# Patient Record
Sex: Female | Born: 1950
Health system: Southern US, Community
[De-identification: ages and names within clinical notes are randomized; demographics above are authoritative.]

## PROBLEM LIST (undated history)

## (undated) DIAGNOSIS — U071 COVID-19: Secondary | ICD-10-CM

## (undated) DIAGNOSIS — E785 Hyperlipidemia, unspecified: Secondary | ICD-10-CM

## (undated) HISTORY — DX: Hyperlipidemia, unspecified: E78.5

## (undated) HISTORY — PX: TUBAL LIGATION: SHX77

## (undated) HISTORY — PX: CHOLECYSTECTOMY: SHX55

---

## 2005-08-11 ENCOUNTER — Other Ambulatory Visit: Payer: Self-pay

## 2005-08-11 ENCOUNTER — Emergency Department: Payer: Self-pay | Admitting: Emergency Medicine

## 2005-12-25 ENCOUNTER — Emergency Department: Payer: Self-pay | Admitting: Emergency Medicine

## 2010-05-14 ENCOUNTER — Emergency Department: Payer: Self-pay | Admitting: Emergency Medicine

## 2010-10-16 ENCOUNTER — Emergency Department: Payer: Self-pay | Admitting: Emergency Medicine

## 2013-12-14 ENCOUNTER — Emergency Department: Payer: Self-pay | Admitting: Emergency Medicine

## 2014-02-08 ENCOUNTER — Emergency Department: Payer: Self-pay | Admitting: Internal Medicine

## 2014-02-14 ENCOUNTER — Encounter (HOSPITAL_COMMUNITY): Payer: Self-pay | Admitting: Emergency Medicine

## 2014-02-14 ENCOUNTER — Emergency Department (HOSPITAL_COMMUNITY): Payer: Self-pay

## 2014-02-14 DIAGNOSIS — J40 Bronchitis, not specified as acute or chronic: Secondary | ICD-10-CM | POA: Insufficient documentation

## 2014-02-14 DIAGNOSIS — J111 Influenza due to unidentified influenza virus with other respiratory manifestations: Secondary | ICD-10-CM | POA: Insufficient documentation

## 2014-02-14 LAB — I-STAT TROPONIN, ED: Troponin i, poc: 0.01 ng/mL (ref 0.00–0.08)

## 2014-02-14 LAB — BASIC METABOLIC PANEL
BUN: 10 mg/dL (ref 6–23)
CHLORIDE: 100 meq/L (ref 96–112)
CO2: 23 meq/L (ref 19–32)
Calcium: 9.4 mg/dL (ref 8.4–10.5)
Creatinine, Ser: 0.74 mg/dL (ref 0.50–1.10)
GFR calc Af Amer: >90 mL/min (ref >90)
GFR calc non Af Amer: 90 mL/min — ABNORMAL LOW (ref >90)
GLUCOSE: 113 mg/dL — AB (ref 70–99)
Potassium: 3.7 mEq/L (ref 3.7–5.3)
SODIUM: 140 meq/L (ref 137–147)

## 2014-02-14 LAB — CBC
HCT: 38.6 % (ref 36.0–46.0)
HEMOGLOBIN: 13.7 g/dL (ref 12.0–15.0)
MCH: 29.7 pg (ref 26.0–34.0)
MCHC: 35.5 g/dL (ref 30.0–36.0)
MCV: 83.5 fL (ref 78.0–100.0)
Platelets: 256 10*3/uL (ref 150–400)
RBC: 4.62 MIL/uL (ref 3.87–5.11)
RDW: 13.2 % (ref 11.5–15.5)
WBC: 6.5 10*3/uL (ref 4.0–10.5)

## 2014-02-14 NOTE — ED Notes (Signed)
PT states diagnosed with the Flu on Friday and now with chest and rib pain, cough.  Pt reports some sob.

## 2014-02-15 ENCOUNTER — Emergency Department (HOSPITAL_COMMUNITY)
Admission: EM | Admit: 2014-02-15 | Discharge: 2014-02-15 | Disposition: A | Discharge status: Home / Self Care | Payer: Self-pay | Attending: Emergency Medicine | Admitting: Emergency Medicine

## 2014-02-15 DIAGNOSIS — J111 Influenza due to unidentified influenza virus with other respiratory manifestations: Secondary | ICD-10-CM

## 2014-02-15 DIAGNOSIS — J40 Bronchitis, not specified as acute or chronic: Secondary | ICD-10-CM

## 2014-02-15 LAB — HEPATIC FUNCTION PANEL
ALBUMIN: 3.5 g/dL (ref 3.5–5.2)
ALT: 11 U/L (ref 0–35)
AST: 15 U/L (ref 0–37)
Alkaline Phosphatase: 57 U/L (ref 39–117)
TOTAL PROTEIN: 7 g/dL (ref 6.0–8.3)
Total Bilirubin: 0.4 mg/dL (ref 0.3–1.2)

## 2014-02-15 MED ORDER — HYDROCODONE-ACETAMINOPHEN 5-325 MG PO TABS: 1.0000 | ORAL_TABLET | ORAL | Status: DC | PRN

## 2014-02-15 MED ORDER — IPRATROPIUM BROMIDE 0.02 % IN SOLN
RESPIRATORY_TRACT | Status: AC
  Administered 2014-02-15: 03:00:00
  Filled 2014-02-15: qty 2.5

## 2014-02-15 MED ORDER — ALBUTEROL SULFATE (2.5 MG/3ML) 0.083% IN NEBU
INHALATION_SOLUTION | RESPIRATORY_TRACT | Status: AC
  Administered 2014-02-15: 03:00:00
  Filled 2014-02-15: qty 3

## 2014-02-15 MED ORDER — METHYLPREDNISOLONE SODIUM SUCC 125 MG IJ SOLR
125.0000 mg | Freq: Once | INTRAMUSCULAR | Status: AC
  Administered 2014-02-15: 125 mg via INTRAVENOUS
  Filled 2014-02-15: qty 2

## 2014-02-15 MED ORDER — ONDANSETRON HCL 4 MG/2ML IJ SOLN
INTRAMUSCULAR | Status: AC
  Filled 2014-02-15: qty 2

## 2014-02-15 MED ORDER — PREDNISONE 20 MG PO TABS: 60.0000 mg | ORAL_TABLET | Freq: Every day | ORAL | Status: DC

## 2014-02-15 NOTE — ED Provider Notes (Signed)
CSN: [id number]     Arrival date & time 02/14/14  2226 History   First MD Initiated Contact with Patient 02/15/14 [phone]     Chief Complaint  Patient presents with  . Chest Pain     (Consider location/radiation/quality/duration/timing/severity/associated sxs/prior Treatment) Patient is a 63 y.o. female presenting with chest pain. The history is provided by the patient.  Chest Pain She has been sick for about one week with a nonproductive cough with associated nausea but no vomiting. Denies fever or sweats. She had some chills last night but does feel at times she had chills. There's been associated anorexia and generalized weakness. There has been some aching in her back and in her legs. She rates that pain at 5/10. She went to another facility 4 days ago and had a positive test for influenza and was given a prescription for an inhaler. She's been using the inhaler with no benefit. She was not started on any antiviral medications. She did have some mild diarrhea today.  History reviewed. No pertinent past medical history. Past Surgical History  Procedure Laterality Date  . Cholecystectomy    . Tubal ligation     No family history on file. History  Substance Use Topics  . Smoking status: Never Smoker   . Smokeless tobacco: Not on file  . Alcohol Use: No   OB History   Grav Para Term Preterm Abortions TAB SAB Ect Mult Living                 Review of Systems  Cardiovascular: Positive for chest pain.  All other systems reviewed and are negative.     Allergies  Review of patient's allergies indicates no known allergies.  Home Medications   Current Outpatient Rx  Name  Route  Sig  Dispense  Refill  . HYDROcodone-acetaminophen (NORCO) 5-325 MG per tablet   Oral   Take 1 tablet by mouth every 4 (four) hours as needed for moderate pain (or coughing).   10 tablet   0   . predniSONE (DELTASONE) 20 MG tablet   Oral   Take 3 tablets (60 mg total) by mouth daily.   15 tablet    0    BP 131/81  Pulse 98  Temp(Src) 99.1 F (37.3 C) (Oral)  Resp 18  SpO2 96% Physical Exam  Nursing note and vitals reviewed.  63 year old female, resting comfortably and in no acute distress. Vital signs are normal. Oxygen saturation is 96%, which is normal. Head is normocephalic and atraumatic. PERRLA, EOMI. Oropharynx is clear. Neck is nontender and supple without adenopathy or JVD. Back is nontender and there is no CVA tenderness. Lungs have slightly prolonged exhalation phase without overt rales, wheezes, or rhonchi with tidal breathing. With forced exhalation, moderate expiratory rhonchi are present. Chest is nontender. Heart has regular rate and rhythm without murmur. Abdomen is soft, flat, nontender without masses or hepatosplenomegaly and peristalsis is normoactive. Extremities have no cyanosis or edema, full range of motion is present. Skin is warm and dry without rash. Neurologic: Mental status is normal, cranial nerves are intact, there are no motor or sensory deficits.  ED Course  Procedures (including critical care time) Labs Review Results for orders placed during the hospital encounter of 02/15/14  CBC      Result Value Ref Range   WBC 6.5  4.0 - 10.5 K/uL   RBC 4.62  3.87 - 5.11 MIL/uL   Hemoglobin 13.7  12.0 - 15.0 g/dL  HCT 38.6  36.0 - 46.0 %   MCV 83.5  78.0 - 100.0 fL   MCH 29.7  26.0 - 34.0 pg   MCHC 35.5  30.0 - 36.0 g/dL   RDW 69.6  29.5 - 28.4 %   Platelets 256  150 - 400 K/uL  BASIC METABOLIC PANEL      Result Value Ref Range   Sodium 140  137 - 147 mEq/L   Potassium 3.7  3.7 - 5.3 mEq/L   Chloride 100  96 - 112 mEq/L   CO2 23  19 - 32 mEq/L   Glucose, Bld 113 (*) 70 - 99 mg/dL   BUN 10  6 - 23 mg/dL   Creatinine, Ser 1.32  0.50 - 1.10 mg/dL   Calcium 9.4  8.4 - 44.0 mg/dL   GFR calc non Af Amer 90 (*) >90 mL/min   GFR calc Af Amer >90  >90 mL/min  HEPATIC FUNCTION PANEL      Result Value Ref Range   Total Protein 7.0  6.0 - 8.3 g/dL    Albumin 3.5  3.5 - 5.2 g/dL   AST 15  0 - 37 U/L   ALT 11  0 - 35 U/L   Alkaline Phosphatase 57  39 - 117 U/L   Total Bilirubin 0.4  0.3 - 1.2 mg/dL   Bilirubin, Direct <1.0  0.0 - 0.3 mg/dL   Indirect Bilirubin NOT CALCULATED  0.3 - 0.9 mg/dL  I-STAT TROPOININ, ED      Result Value Ref Range   Troponin i, poc 0.01  0.00 - 0.08 ng/mL   Comment 3            Imaging Review Dg Chest 2 View  02/14/2014   CLINICAL DATA:  Chest and rib pain.  Cough.  Shortness of breath.  EXAM: CHEST  2 VIEW  COMPARISON:  Chest radiograph performed 12/14/2013  FINDINGS: The lungs are well-aerated. Mild bibasilar opacities may reflect atelectasis or mild pneumonia, not well characterized on the lateral view. There is no evidence of pleural effusion or pneumothorax.  The heart is borderline normal in size; the mediastinal contour is within normal limits. No acute osseous abnormalities are seen. Clips are noted within the right upper quadrant, reflecting prior cholecystectomy.  IMPRESSION: Mild bibasilar airspace opacities may reflect atelectasis or mild pneumonia.   Electronically Signed   By: Roanna Raider M.D.   On: 02/14/2014 23:22   Images viewed by me.  MDM   Final diagnoses:  Bronchitis  Influenza    Ongoing respiratory symptoms and patient recently diagnosed with influenza. Chest x-ray will be obtained to rule out secondary pneumonia. Laboratory work had been ordered at triage and was unremarkable but did not include hepatic functions. These will be checked. She'll be given a therapeutic trial of albuterol with ipratropium.  She noticed significant improvement with albuterol with ipratropium. Chest x-ray is read by radiologist as atelectasis versus mild pneumonia. I have reviewed the images and I feel that they represent pneumonia. Hepatic function studies are normal. Patient is advised of these results. She is advised to continue using her inhaler at home but she will also be started on prednisone.  She's given a dose of methylprednisolone and sent home with prescription for prednisone. She is complaining of inability to sleep because of coughing. She is given a prescription for hydrocodone-acetaminophen to take at bedtime to help suppress the cough so she can sleep.  Dione Booze, MD 02/15/14 [phone]

## 2014-02-15 NOTE — Discharge Instructions (Signed)
Acute Bronchitis Bronchitis is inflammation of the airways that extend from the windpipe into the lungs (bronchi). The inflammation often causes mucus to develop. This leads to a cough, which is the most common symptom of bronchitis.  In acute bronchitis, the condition usually develops suddenly and goes away over time, usually in a couple weeks. Smoking, allergies, and asthma can make bronchitis worse. Repeated episodes of bronchitis may cause further lung problems.  CAUSES Acute bronchitis is most often caused by the same virus that causes a cold. The virus can spread from person to person (contagious).  SIGNS AND SYMPTOMS   Cough.   Fever.   Coughing up mucus.   Body aches.   Chest congestion.   Chills.   Shortness of breath.   Sore throat.  DIAGNOSIS  Acute bronchitis is usually diagnosed through a physical exam. Tests, such as chest X-rays, are sometimes done to rule out other conditions.  TREATMENT  Acute bronchitis usually goes away in a couple weeks. Often times, no medical treatment is necessary. Medicines are sometimes given for relief of fever or cough. Antibiotics are usually not needed but may be prescribed in certain situations. In some cases, an inhaler may be recommended to help reduce shortness of breath and control the cough. A cool mist vaporizer may also be used to help thin bronchial secretions and make it easier to clear the chest.  HOME CARE INSTRUCTIONS  Get plenty of rest.   Drink enough fluids to keep your urine clear or pale yellow (unless you have a medical condition that requires fluid restriction). Increasing fluids may help thin your secretions and will prevent dehydration.   Only take over-the-counter or prescription medicines as directed by your health care provider.   Avoid smoking and secondhand smoke. Exposure to cigarette smoke or irritating chemicals will make bronchitis worse. If you are a smoker, consider using nicotine gum or skin  patches to help control withdrawal symptoms. Quitting smoking will help your lungs heal faster.   Reduce the chances of another bout of acute bronchitis by washing your hands frequently, avoiding people with cold symptoms, and trying not to touch your hands to your mouth, nose, or eyes.   Follow up with your health care provider as directed.  SEEK MEDICAL CARE IF: Your symptoms do not improve after 1 week of treatment.  SEEK IMMEDIATE MEDICAL CARE IF:  You develop an increased fever or chills.   You have chest pain.   You have severe shortness of breath.  You have bloody sputum.   You develop dehydration.  You develop fainting.  You develop repeated vomiting.  You develop a severe headache. MAKE SURE YOU:   Understand these instructions.  Will watch your condition.  Will get help right away if you are not doing well or get worse. Document Released: 12/04/2004 Document Revised: 06/29/2013 Document Reviewed: 04/19/2013 [facility] Patient Information 2014 [place].  Prednisone tablets What is this medicine? PREDNISONE (PRED ni sone) is a corticosteroid. It is commonly used to treat inflammation of the skin, joints, lungs, and other organs. Common conditions treated include asthma, allergies, and arthritis. It is also used for other conditions, such as blood disorders and diseases of the adrenal glands. This medicine may be used for other purposes; ask your health care provider or pharmacist if you have questions. COMMON BRAND NAME(S): Deltasone, Predone, Sterapred DS, Sterapred What should I tell my health care provider before I take this medicine? They need to know if you have any of these  conditions: -Cushing's syndrome -diabetes -glaucoma -heart disease -high blood pressure -infection (especially a virus infection such as chickenpox, cold sores, or herpes) -kidney disease -liver disease -mental illness -myasthenia gravis -osteoporosis -seizures -stomach  or intestine problems -thyroid disease -an unusual or allergic reaction to lactose, prednisone, other medicines, foods, dyes, or preservatives -pregnant or trying to get pregnant -breast-feeding How should I use this medicine? Take this medicine by mouth with a glass of water. Follow the directions on the prescription label. Take this medicine with food. If you are taking this medicine once a day, take it in the morning. Do not take more medicine than you are told to take. Do not suddenly stop taking your medicine because you may develop a severe reaction. Your doctor will tell you how much medicine to take. If your doctor wants you to stop the medicine, the dose may be slowly lowered over time to avoid any side effects. Talk to your pediatrician regarding the use of this medicine in children. Special care may be needed. Overdosage: If you think you have taken too much of this medicine contact a poison control center or emergency room at once. NOTE: This medicine is only for you. Do not share this medicine with others. What if I miss a dose? If you miss a dose, take it as soon as you can. If it is almost time for your next dose, talk to your doctor or health care professional. You may need to miss a dose or take an extra dose. Do not take double or extra doses without advice. What may interact with this medicine? Do not take this medicine with any of the following medications: -metyrapone -mifepristone This medicine may also interact with the following medications: -aminoglutethimide -amphotericin B -aspirin and aspirin-like medicines -barbiturates -certain medicines for diabetes, like glipizide or glyburide -cholestyramine -cholinesterase inhibitors -cyclosporine -digoxin -diuretics -ephedrine -female hormones, like estrogens and birth control pills -isoniazid -ketoconazole -NSAIDS, medicines for pain and inflammation, like ibuprofen or  naproxen -phenytoin -rifampin -toxoids -vaccines -warfarin This list may not describe all possible interactions. Give your health care provider a list of all the medicines, herbs, non-prescription drugs, or dietary supplements you use. Also tell them if you smoke, drink alcohol, or use illegal drugs. Some items may interact with your medicine. What should I watch for while using this medicine? Visit your doctor or health care professional for regular checks on your progress. If you are taking this medicine over a prolonged period, carry an identification card with your name and address, the type and dose of your medicine, and your doctor's name and address. This medicine may increase your risk of getting an infection. Tell your doctor or health care professional if you are around anyone with measles or chickenpox, or if you develop sores or blisters that do not heal properly. If you are going to have surgery, tell your doctor or health care professional that you have taken this medicine within the last twelve months. Ask your doctor or health care professional about your diet. You may need to lower the amount of salt you eat. This medicine may affect blood sugar levels. If you have diabetes, check with your doctor or health care professional before you change your diet or the dose of your diabetic medicine. What side effects may I notice from receiving this medicine? Side effects that you should report to your doctor or health care professional as soon as possible: -allergic reactions like skin rash, itching or hives, swelling of the face, lips,  or tongue -changes in emotions or moods -changes in vision -depressed mood -eye pain -fever or chills, cough, sore throat, pain or difficulty passing urine -increased thirst -swelling of ankles, feet Side effects that usually do not require medical attention (report to your doctor or health care professional if they continue or are  bothersome): -confusion, excitement, restlessness -headache -nausea, vomiting -skin problems, acne, thin and shiny skin -trouble sleeping -weight gain This list may not describe all possible side effects. Call your doctor for medical advice about side effects. You may report side effects to FDA at 1-800-FDA-1088. Where should I keep my medicine? Keep out of the reach of children. Store at room temperature between 15 and 30 degrees C (59 and 86 degrees F). Protect from light. Keep container tightly closed. Throw away any unused medicine after the expiration date. NOTE: This sheet is a summary. It may not cover all possible information. If you have questions about this medicine, talk to your doctor, pharmacist, or health care provider.  2014, Elsevier/Gold Standard. (2011-06-12 10:57:14)  Acetaminophen; Hydrocodone tablets or capsules What is this medicine? ACETAMINOPHEN; HYDROCODONE (a set a MEE noe fen; hye droe KOE done) is a pain reliever. It is used to treat mild to moderate pain. This medicine may be used for other purposes; ask your health care provider or pharmacist if you have questions. COMMON BRAND NAME(S): Anexsia, Bancap HC , Ceta-Plus, Co-Gesic, Comfortpak , Dolagesic, [place], [address] , [address] , Hydrogesic, Lorcet HD, Lorcet Plus, Lorcet, [place], Margesic H, Maxidone, [place], Polygesic, [place], [place], Vicodin ES, Vicodin HP, Vicodin, [place] [employer] What should I tell my health care provider before I take this medicine? They need to know if you have any of these conditions: -brain tumor -Crohn's disease, inflammatory bowel disease, or ulcerative colitis -drug abuse or addiction -head injury -heart or circulation problems -if you often drink alcohol -kidney disease or problems going to the bathroom -liver disease -lung disease, asthma, or breathing problems -an unusual or allergic reaction to acetaminophen, hydrocodone, other opioid analgesics, other medicines,  foods, dyes, or preservatives -pregnant or trying to get pregnant -breast-feeding How should I use this medicine? Take this medicine by mouth. Swallow it with a full glass of water. Follow the directions on the prescription label. If the medicine upsets your stomach, take the medicine with food or milk. Do not take more than you are told to take. Talk to your pediatrician regarding the use of this medicine in children. This medicine is not approved for use in children. Overdosage: If you think you have taken too much of this medicine contact a poison control center or emergency room at once. NOTE: This medicine is only for you. Do not share this medicine with others. What if I miss a dose? If you miss a dose, take it as soon as you can. If it is almost time for your next dose, take only that dose. Do not take double or extra doses. What may interact with this medicine? -alcohol -antihistamines -isoniazid -medicines for depression, anxiety, or psychotic disturbances -medicines for sleep -muscle relaxants -naltrexone -narcotic medicines (opiates) for pain -phenobarbital -ritonavir -tramadol This list may not describe all possible interactions. Give your health care provider a list of all the medicines, herbs, non-prescription drugs, or dietary supplements you use. Also tell them if you smoke, drink alcohol, or use illegal drugs. Some items may interact with your medicine. What should I watch for while using this medicine? Tell your doctor or health care professional if your pain  does not go away, if it gets worse, or if you have new or a different type of pain. You may develop tolerance to the medicine. Tolerance means that you will need a higher dose of the medicine for pain relief. Tolerance is normal and is expected if you take the medicine for a long time. Do not suddenly stop taking your medicine because you may develop a severe reaction. Your body becomes used to the medicine. This does  NOT mean you are addicted. Addiction is a behavior related to getting and using a drug for a non-medical reason. If you have pain, you have a medical reason to take pain medicine. Your doctor will tell you how much medicine to take. If your doctor wants you to stop the medicine, the dose will be slowly lowered over time to avoid any side effects. You may get drowsy or dizzy when you first start taking the medicine or change doses. Do not drive, use machinery, or do anything that may be dangerous until you know how the medicine affects you. Stand or sit up slowly. There are different types of narcotic medicines (opiates) for pain. If you take more than one type at the same time, you may have more side effects. Give your health care provider a list of all medicines you use. Your doctor will tell you how much medicine to take. Do not take more medicine than directed. Call emergency for help if you have problems breathing. The medicine will cause constipation. Try to have a bowel movement at least every 2 to 3 days. If you do not have a bowel movement for 3 days, call your doctor or health care professional. Too much acetaminophen can be very dangerous. Do not take Tylenol (acetaminophen) or medicines that contain acetaminophen with this medicine. Many non-prescription medicines contain acetaminophen. Always read the labels carefully. What side effects may I notice from receiving this medicine? Side effects that you should report to your doctor or health care professional as soon as possible: -allergic reactions like skin rash, itching or hives, swelling of the face, lips, or tongue -breathing problems -confusion -feeling faint or lightheaded, falls -stomach pain -yellowing of the eyes or skin Side effects that usually do not require medical attention (report to your doctor or health care professional if they continue or are bothersome): -nausea, vomiting -stomach upset This list may not describe all  possible side effects. Call your doctor for medical advice about side effects. You may report side effects to FDA at 1-800-FDA-1088. Where should I keep my medicine? Keep out of the reach of children. This medicine can be abused. Keep your medicine in a safe place to protect it from theft. Do not share this medicine with anyone. Selling or giving away this medicine is dangerous and against the law. Store at room temperature between 15 and 30 degrees C (59 and 86 degrees F). Protect from light. Keep container tightly closed.  Throw away any unused medicine after the expiration date. Discard unused medicine and used packaging carefully. Pets and children can be harmed if they find used or lost packages. NOTE: This sheet is a summary. It may not cover all possible information. If you have questions about this medicine, talk to your doctor, pharmacist, or health care provider.  2014, Elsevier/Gold Standard. (2013-06-20 13:15:56)

## 2014-02-15 NOTE — ED Notes (Signed)
See downtime charting for additional details about visit.

## 2014-02-22 ENCOUNTER — Emergency Department (HOSPITAL_COMMUNITY): Payer: Self-pay

## 2014-02-22 ENCOUNTER — Other Ambulatory Visit: Payer: Self-pay

## 2014-02-22 ENCOUNTER — Emergency Department (HOSPITAL_COMMUNITY)
Admission: EM | Admit: 2014-02-22 | Discharge: 2014-02-22 | Disposition: A | Discharge status: Home / Self Care | Payer: Self-pay | Attending: Emergency Medicine | Admitting: Emergency Medicine

## 2014-02-22 ENCOUNTER — Encounter (HOSPITAL_COMMUNITY): Payer: Self-pay | Admitting: Emergency Medicine

## 2014-02-22 DIAGNOSIS — F419 Anxiety disorder, unspecified: Secondary | ICD-10-CM

## 2014-02-22 DIAGNOSIS — R059 Cough, unspecified: Secondary | ICD-10-CM | POA: Insufficient documentation

## 2014-02-22 DIAGNOSIS — R110 Nausea: Secondary | ICD-10-CM | POA: Insufficient documentation

## 2014-02-22 DIAGNOSIS — R197 Diarrhea, unspecified: Secondary | ICD-10-CM | POA: Insufficient documentation

## 2014-02-22 DIAGNOSIS — IMO0002 Reserved for concepts with insufficient information to code with codable children: Secondary | ICD-10-CM | POA: Insufficient documentation

## 2014-02-22 DIAGNOSIS — R0602 Shortness of breath: Secondary | ICD-10-CM | POA: Insufficient documentation

## 2014-02-22 DIAGNOSIS — R05 Cough: Secondary | ICD-10-CM

## 2014-02-22 DIAGNOSIS — F411 Generalized anxiety disorder: Secondary | ICD-10-CM | POA: Insufficient documentation

## 2014-02-22 DIAGNOSIS — R6883 Chills (without fever): Secondary | ICD-10-CM | POA: Insufficient documentation

## 2014-02-22 DIAGNOSIS — R42 Dizziness and giddiness: Secondary | ICD-10-CM | POA: Insufficient documentation

## 2014-02-22 LAB — COMPREHENSIVE METABOLIC PANEL
ALBUMIN: 3.4 g/dL — AB (ref 3.5–5.2)
ALT: 16 U/L (ref 0–35)
AST: 15 U/L (ref 0–37)
Alkaline Phosphatase: 56 U/L (ref 39–117)
BUN: 11 mg/dL (ref 6–23)
CO2: 21 mEq/L (ref 19–32)
CREATININE: 0.79 mg/dL (ref 0.50–1.10)
Calcium: 9.2 mg/dL (ref 8.4–10.5)
Chloride: 101 mEq/L (ref 96–112)
GFR calc Af Amer: >90 mL/min (ref >90)
GFR calc non Af Amer: 88 mL/min — ABNORMAL LOW (ref >90)
GLUCOSE: 128 mg/dL — AB (ref 70–99)
Potassium: 3.7 mEq/L (ref 3.7–5.3)
Sodium: 140 mEq/L (ref 137–147)
TOTAL PROTEIN: 6.4 g/dL (ref 6.0–8.3)
Total Bilirubin: 0.3 mg/dL (ref 0.3–1.2)

## 2014-02-22 LAB — CBC WITH DIFFERENTIAL/PLATELET
BASOS PCT: 0 % (ref 0–1)
Basophils Absolute: 0 10*3/uL (ref 0.0–0.1)
EOS ABS: 0.2 10*3/uL (ref 0.0–0.7)
EOS PCT: 2 % (ref 0–5)
HEMATOCRIT: 39.1 % (ref 36.0–46.0)
HEMOGLOBIN: 13.7 g/dL (ref 12.0–15.0)
LYMPHS ABS: 2.8 10*3/uL (ref 0.7–4.0)
Lymphocytes Relative: 35 % (ref 12–46)
MCH: 29.3 pg (ref 26.0–34.0)
MCHC: 35 g/dL (ref 30.0–36.0)
MCV: 83.7 fL (ref 78.0–100.0)
MONO ABS: 0.5 10*3/uL (ref 0.1–1.0)
Monocytes Relative: 7 % (ref 3–12)
Neutro Abs: 4.4 10*3/uL (ref 1.7–7.7)
Neutrophils Relative %: 56 % (ref 43–77)
Platelets: 266 10*3/uL (ref 150–400)
RBC: 4.67 MIL/uL (ref 3.87–5.11)
RDW: 13.5 % (ref 11.5–15.5)
WBC: 7.9 10*3/uL (ref 4.0–10.5)

## 2014-02-22 LAB — TROPONIN I

## 2014-02-22 LAB — PRO B NATRIURETIC PEPTIDE: Pro B Natriuretic peptide (BNP): 76.3 pg/mL (ref 0–125)

## 2014-02-22 MED ORDER — LORAZEPAM 1 MG PO TABS
1.0000 mg | ORAL_TABLET | Freq: Once | ORAL | Status: AC
  Administered 2014-02-22: 1 mg via ORAL
  Filled 2014-02-22: qty 1

## 2014-02-22 MED ORDER — LORAZEPAM 0.5 MG PO TABS: 0.5000 mg | ORAL_TABLET | Freq: Three times a day (TID) | ORAL | Status: DC | PRN

## 2014-02-22 MED ORDER — BENZONATATE 100 MG PO CAPS: 100.0000 mg | ORAL_CAPSULE | Freq: Three times a day (TID) | ORAL | Status: DC

## 2014-02-22 NOTE — ED Notes (Signed)
Pt dc to home. Pt sts understanding to dc instructions. Pt taken to exit with w/c. nadn.

## 2014-02-22 NOTE — Discharge Instructions (Signed)
Cough, Adult  A cough is a reflex that helps clear your throat and airways. It can help heal the body or may be a reaction to an irritated airway. A cough may only last 2 or 3 weeks (acute) or may last more than 8 weeks (chronic).  CAUSES Acute cough:  Viral or bacterial infections. Chronic cough:  Infections.  Allergies.  Asthma.  Post-nasal drip.  Smoking.  Heartburn or acid reflux.  Some medicines.  Chronic lung problems (COPD).  Cancer. SYMPTOMS   Cough.  Fever.  Chest pain.  Increased breathing rate.  High-pitched whistling sound when breathing (wheezing).  Colored mucus that you cough up (sputum). TREATMENT   A bacterial cough may be treated with antibiotic medicine.  A viral cough must run its course and will not respond to antibiotics.  Your caregiver may recommend other treatments if you have a chronic cough. HOME CARE INSTRUCTIONS   Only take over-the-counter or prescription medicines for pain, discomfort, or fever as directed by your caregiver. Use cough suppressants only as directed by your caregiver.  Use a cold steam vaporizer or humidifier in your bedroom or home to help loosen secretions.  Sleep in a semi-upright position if your cough is worse at night.  Rest as needed.  Stop smoking if you smoke. SEEK IMMEDIATE MEDICAL CARE IF:   You have pus in your sputum.  Your cough starts to worsen.  You cannot control your cough with suppressants and are losing sleep.  You begin coughing up blood.  You have difficulty breathing.  You develop pain which is getting worse or is uncontrolled with medicine.  You have a fever. MAKE SURE YOU:   Understand these instructions.  Will watch your condition.  Will get help right away if you are not doing well or get worse. Document Released: 04/25/2011 Document Revised: 01/19/2012 Document Reviewed: 04/25/2011 [facility] Patient Information 2014 [place], [place].  Panic Attacks Panic attacks  are sudden, short-livedsurges of severe anxiety, fear, or discomfort. They may occur for no reason when you are relaxed, when you are anxious, or when you are sleeping. Panic attacks may occur for a number of reasons:   Healthy people occasionally have panic attacks in extreme, life-threatening situations, such as war or natural disasters. Normal anxiety is a protective mechanism of the body that helps us react to danger (fight or flight response).  Panic attacks are often seen with anxiety disorders, such as panic disorder, social anxiety disorder, generalized anxiety disorder, and phobias. Anxiety disorders cause excessive or uncontrollable anxiety. They may interfere with your relationships or other life activities.  Panic attacks are sometimes seen with other mental illnesses such as depression and posttraumatic stress disorder.  Certain medical conditions, prescription medicines, and drugs of abuse can cause panic attacks. SYMPTOMS  Panic attacks start suddenly, peak within 20 minutes, and are accompanied by four or more of the following symptoms:  Pounding heart or fast heart rate (palpitations).  Sweating.  Trembling or shaking.  Shortness of breath or feeling smothered.  Feeling choked.  Chest pain or discomfort.  Nausea or strange feeling in your stomach.  Dizziness, lightheadedness, or feeling like you will faint.  Chills or hot flushes.  Numbness or tingling in your lips or hands and feet.  Feeling that things are not real or feeling that you are not yourself.  Fear of losing control or going crazy.  Fear of dying. Some of these symptoms can mimic serious medical conditions. For example, you may think you are having  a heart attack. Although panic attacks can be very scary, they are not life threatening. DIAGNOSIS  Panic attacks are diagnosed through an assessment by your health care provider. Your health care provider will ask questions about your symptoms, such as  where and when they occurred. Your health care provider will also ask about your medical history and use of alcohol and drugs, including prescription medicines. Your health care provider may order blood tests or other studies to rule out a serious medical condition. Your health care provider may refer you to a mental health professional for further evaluation. TREATMENT   Most healthy people who have one or two panic attacks in an extreme, life-threatening situation will not require treatment.  The treatment for panic attacks associated with anxiety disorders or other mental illness typically involves counseling with a mental health professional, medicine, or a combination of both. Your health care provider will help determine what treatment is best for you.  Panic attacks due to physical illness usually goes away with treatment of the illness. If prescription medicine is causing panic attacks, talk with your health care provider about stopping the medicine, decreasing the dose, or substituting another medicine.  Panic attacks due to alcohol or drug abuse goes away with abstinence. Some adults need professional help in order to stop drinking or using drugs. HOME CARE INSTRUCTIONS   Take all your medicines as prescribed.   Check with your health care provider before starting new prescription or over-the-counter medicines.  Keep all follow up appointments with your health care provider. SEEK MEDICAL CARE IF:  You are not able to take your medicines as prescribed.  Your symptoms do not improve or get worse. SEEK IMMEDIATE MEDICAL CARE IF:   You experience panic attack symptoms that are different than your usual symptoms.  You have serious thoughts about hurting yourself or others.  You are taking medicine for panic attacks and have a serious side effect. MAKE SURE YOU:  Understand these instructions.  Will watch your condition.  Will get help right away if you are not doing well or  get worse. Document Released: 10/27/2005 Document Revised: 08/17/2013 Document Reviewed: 06/10/2013 [facility] Patient Information 2014 [place], [place].

## 2014-02-22 NOTE — ED Notes (Signed)
Patient reports flu like symptoms since 2 fridays ago, sts here last Tuesday for same and now presents with diarrhea and cough, sts she finished meds and still has not improved.

## 2014-02-22 NOTE — ED Provider Notes (Signed)
CSN: [id number]     Arrival date & time 02/22/14  0403 History   First MD Initiated Contact with Patient 02/22/14 [phone]     Chief Complaint  Patient presents with  . Diarrhea  . Cough  . Influenza     (Consider location/radiation/quality/duration/timing/severity/associated sxs/prior Treatment) HPI Patient has been treated for bronchitis recently and states that her symptoms had been improving. Over the last 24 hours she's had progressive shortness of breath, nonproductive cough, multiple episodes of diarrhea and nausea. Patient is now very anxious saying that she feels she is going to die if she can't cough up the mucus out of her chest. She denies any lower sugary swelling or pain. She denies any chest tightness or pain. She's had no fevers but admits to chills. She has no abdominal pain. She is asking for something for her "nerves." History reviewed. No pertinent past medical history. Past Surgical History  Procedure Laterality Date  . Cholecystectomy    . Tubal ligation     History reviewed. No pertinent family history. History  Substance Use Topics  . Smoking status: Never Smoker   . Smokeless tobacco: Not on file  . Alcohol Use: No   OB History   Grav Para Term Preterm Abortions TAB SAB Ect Mult Living                 Review of Systems  Constitutional: Positive for chills. Negative for fever.  HENT: Negative for congestion.   Respiratory: Positive for cough and shortness of breath. Negative for chest tightness and wheezing.   Cardiovascular: Negative for chest pain, palpitations and leg swelling.  Gastrointestinal: Positive for nausea and diarrhea. Negative for vomiting and abdominal pain.  Musculoskeletal: Negative for back pain, myalgias, neck pain and neck stiffness.  Skin: Negative for rash and wound.  Neurological: Positive for dizziness and light-headedness. Negative for weakness, numbness and headaches.  All other systems reviewed and are  negative.     Allergies  Review of patient's allergies indicates no known allergies.  Home Medications   Prior to Admission medications   Medication Sig Start Date End Date Taking? Authorizing Provider  acetaminophen (TYLENOL) 500 MG tablet Take 500-1,000 mg by mouth every 6 (six) hours as needed for moderate pain or headache.    Historical Provider, MD  HYDROcodone-acetaminophen (NORCO) 5-325 MG per tablet Take 1 tablet by mouth every 4 (four) hours as needed for moderate pain (or coughing). 02/15/14   Dione Boozeavid Glick, MD  predniSONE (DELTASONE) 20 MG tablet Take 3 tablets (60 mg total) by mouth daily. 02/15/14   Dione Boozeavid Glick, MD   BP 144/59  Pulse 77  Temp(Src) 98.6 F (37 C) (Oral)  Resp 20  Ht 5\' 2"  (1.575 m)  Wt 179 lb (81.194 kg)  BMI 32.73 kg/m2  SpO2 99% Physical Exam  Nursing note and vitals reviewed. Constitutional: She is oriented to person, place, and time. She appears well-developed and well-nourished. No distress.  Patient is anxious appearing and hyperventilating.  HENT:  Head: Normocephalic and atraumatic.  Mouth/Throat: Oropharynx is clear and moist. No oropharyngeal exudate.  Eyes: EOM are normal. Pupils are equal, round, and reactive to light.  Neck: Normal range of motion. Neck supple.  Cardiovascular: Normal rate and regular rhythm.  Exam reveals no gallop and no friction rub.   No murmur heard. Pulmonary/Chest: Effort normal and breath sounds normal. No respiratory distress. She has no wheezes. She has no rales. She exhibits no tenderness.  Occasional cough.  Abdominal: Soft. Bowel  sounds are normal. She exhibits no distension and no mass. There is no tenderness. There is no rebound and no guarding.  Musculoskeletal: Normal range of motion. She exhibits no edema and no tenderness.  No calf swelling or tenderness.  Neurological: She is alert and oriented to person, place, and time.  Moves all extremities without deficit. Sensation is intact.  Skin: Skin is warm  and dry. No rash noted. No erythema.  Psychiatric:  Anxious    ED Course  Procedures (including critical care time) Labs Review Labs Reviewed  CBC WITH DIFFERENTIAL  COMPREHENSIVE METABOLIC PANEL  PRO B NATRIURETIC PEPTIDE  TROPONIN I    Imaging Review No results found.   EKG Interpretation None      MDM   Final diagnoses:  None    Patient is very anxious. Her lungs are clear. Her vital signs are stable. We'll screen and treat anxiety.  Patient is now much more comfortable. She is not coughing and she is to shortness of breath. She is asking for cough and sleeping medication. Her vital signs remained stable. She's been given return precautions and is voiced understanding.  Loren Raceravid Gemayel Mascio, MD 02/22/14 [phone]

## 2015-01-21 ENCOUNTER — Emergency Department: Payer: Self-pay | Admitting: Emergency Medicine

## 2015-07-23 ENCOUNTER — Emergency Department
Admission: EM | Admit: 2015-07-23 | Discharge: 2015-07-23 | Discharge status: Against Medical Advice | Payer: No Typology Code available for payment source | Attending: Emergency Medicine | Admitting: Emergency Medicine

## 2015-07-23 ENCOUNTER — Encounter: Payer: Self-pay | Admitting: Emergency Medicine

## 2015-07-23 DIAGNOSIS — Y998 Other external cause status: Secondary | ICD-10-CM | POA: Insufficient documentation

## 2015-07-23 DIAGNOSIS — S3992XA Unspecified injury of lower back, initial encounter: Secondary | ICD-10-CM | POA: Insufficient documentation

## 2015-07-23 DIAGNOSIS — S199XXA Unspecified injury of neck, initial encounter: Secondary | ICD-10-CM | POA: Diagnosis present

## 2015-07-23 DIAGNOSIS — Y9389 Activity, other specified: Secondary | ICD-10-CM | POA: Diagnosis not present

## 2015-07-23 DIAGNOSIS — Y92410 Unspecified street and highway as the place of occurrence of the external cause: Secondary | ICD-10-CM | POA: Diagnosis not present
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
## 2015-07-24 ENCOUNTER — Emergency Department
Admission: EM | Admit: 2015-07-24 | Discharge: 2015-07-24 | Disposition: A | Discharge status: Home / Self Care | Payer: No Typology Code available for payment source | Attending: Emergency Medicine | Admitting: Emergency Medicine

## 2015-07-24 ENCOUNTER — Emergency Department: Payer: No Typology Code available for payment source

## 2015-07-24 ENCOUNTER — Encounter: Payer: Self-pay | Admitting: Emergency Medicine

## 2015-07-24 DIAGNOSIS — Y92410 Unspecified street and highway as the place of occurrence of the external cause: Secondary | ICD-10-CM | POA: Insufficient documentation

## 2015-07-24 DIAGNOSIS — S39012A Strain of muscle, fascia and tendon of lower back, initial encounter: Secondary | ICD-10-CM | POA: Insufficient documentation

## 2015-07-24 DIAGNOSIS — S161XXA Strain of muscle, fascia and tendon at neck level, initial encounter: Secondary | ICD-10-CM

## 2015-07-24 DIAGNOSIS — Y998 Other external cause status: Secondary | ICD-10-CM | POA: Diagnosis not present

## 2015-07-24 DIAGNOSIS — Y9389 Activity, other specified: Secondary | ICD-10-CM | POA: Insufficient documentation

## 2015-07-24 DIAGNOSIS — S3992XA Unspecified injury of lower back, initial encounter: Secondary | ICD-10-CM | POA: Diagnosis present

## 2015-07-24 MED ORDER — TRAMADOL HCL 50 MG PO TABS: 50.0000 mg | ORAL_TABLET | Freq: Four times a day (QID) | ORAL | Status: DC | PRN

## 2015-07-24 MED ORDER — CYCLOBENZAPRINE HCL 10 MG PO TABS: 10.0000 mg | ORAL_TABLET | Freq: Three times a day (TID) | ORAL | Status: DC | PRN

## 2015-07-24 MED ORDER — ACETAMINOPHEN 500 MG PO TABS
1000.0000 mg | ORAL_TABLET | Freq: Once | ORAL | Status: AC
  Administered 2015-07-24: 1000 mg via ORAL
  Filled 2015-07-24: qty 2
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
## 2015-07-24 NOTE — Discharge Instructions (Signed)
Cervical Strain and Sprain (Whiplash) °with Rehab °Cervical strain and sprain are injuries that commonly occur with "whiplash" injuries. Whiplash occurs when the neck is forcefully whipped backward or forward, such as during a motor vehicle accident or during contact sports. The muscles, ligaments, tendons, discs, and nerves of the neck are susceptible to injury when this occurs. °RISK FACTORS °Risk of having a whiplash injury increases if: °· Osteoarthritis of the spine. °· Situations that make head or neck accidents or trauma more likely. °· High-risk sports (football, rugby, wrestling, hockey, auto racing, gymnastics, diving, contact karate, or boxing). °· Poor strength and flexibility of the neck. °· Previous neck injury. °· Poor tackling technique. °· Improperly fitted or padded equipment. °SYMPTOMS  °· Pain or stiffness in the front or back of neck or both. °· Symptoms may present immediately or up to 24 hours after injury. °· Dizziness, headache, nausea, and vomiting. °· Muscle spasm with soreness and stiffness in the neck. °· Tenderness and swelling at the injury site. °PREVENTION °· Learn and use proper technique (avoid tackling with the head, spearing, and head-butting; use proper falling techniques to avoid landing on the head). °· Warm up and stretch properly before activity. °· Maintain physical fitness: °· Strength, flexibility, and endurance. °· Cardiovascular fitness. °· Wear properly fitted and padded protective equipment, such as padded soft collars, for participation in contact sports. °PROGNOSIS  °Recovery from cervical strain and sprain injuries is dependent on the extent of the injury. These injuries are usually curable in 1 week to 3 months with appropriate treatment.  °RELATED COMPLICATIONS  °· Temporary numbness and weakness may occur if the nerve roots are damaged, and this may persist until the nerve has completely healed. °· Chronic pain due to frequent recurrence of  symptoms. °· Prolonged healing, especially if activity is resumed too soon (before complete recovery). °TREATMENT  °Treatment initially involves the use of ice and medication to help reduce pain and inflammation. It is also important to perform strengthening and stretching exercises and modify activities that worsen symptoms so the injury does not get worse. These exercises may be performed at home or with a therapist. For patients who experience severe symptoms, a soft, padded collar may be recommended to be worn around the neck.  °Improving your posture may help reduce symptoms. Posture improvement includes pulling your chin and abdomen in while sitting or standing. If you are sitting, sit in a firm chair with your buttocks against the back of the chair. While sleeping, try replacing your pillow with a small towel rolled to 2 inches in diameter, or use a cervical pillow or soft cervical collar. Poor sleeping positions delay healing.  °For patients with nerve root damage, which causes numbness or weakness, the use of a cervical traction apparatus may be recommended. Surgery is rarely necessary for these injuries. However, cervical strain and sprains that are present at birth (congenital) may require surgery. °MEDICATION  °· If pain medication is necessary, nonsteroidal anti-inflammatory medications, such as aspirin and ibuprofen, or other minor pain relievers, such as acetaminophen, are often recommended. °· Do not take pain medication for 7 days before surgery. °· Prescription pain relievers may be given if deemed necessary by your caregiver. Use only as directed and only as much as you need. °HEAT AND COLD:  °· Cold treatment (icing) relieves pain and reduces inflammation. Cold treatment should be applied for 10 to 15 minutes every 2 to 3 hours for inflammation and pain and immediately after any activity that aggravates   your symptoms. Use ice packs or an ice massage. °· Heat treatment may be used prior to  performing the stretching and strengthening activities prescribed by your caregiver, physical therapist, or athletic trainer. Use a heat pack or a warm soak. °SEEK MEDICAL CARE IF:  °· Symptoms get worse or do not improve in 2 weeks despite treatment. °· New, unexplained symptoms develop (drugs used in treatment may produce side effects). °EXERCISES °RANGE OF MOTION (ROM) AND STRETCHING EXERCISES - Cervical Strain and Sprain °These exercises may help you when beginning to rehabilitate your injury. In order to successfully resolve your symptoms, you must improve your posture. These exercises are designed to help reduce the forward-head and rounded-shoulder posture which contributes to this condition. Your symptoms may resolve with or without further involvement from your physician, physical therapist or athletic trainer. While completing these exercises, remember:  °· Restoring tissue flexibility helps normal motion to return to the joints. This allows healthier, less painful movement and activity. °· An effective stretch should be held for at least 20 seconds, although you may need to begin with shorter hold times for comfort. °· A stretch should never be painful. You should only feel a gentle lengthening or release in the stretched tissue. °STRETCH- Axial Extensors °· Lie on your back on the floor. You may bend your knees for comfort. Place a rolled-up hand towel or dish towel, about 2 inches in diameter, under the part of your head that makes contact with the floor. °· Gently tuck your chin, as if trying to make a "double chin," until you feel a gentle stretch at the base of your head. °· Hold __________ seconds. °Repeat __________ times. Complete this exercise __________ times per day.  °STRETCH - Axial Extension  °· Stand or sit on a firm surface. Assume a good posture: chest up, shoulders drawn back, abdominal muscles slightly tense, knees unlocked (if standing) and feet hip width apart. °· Slowly retract your  chin so your head slides back and your chin slightly lowers. Continue to look straight ahead. °· You should feel a gentle stretch in the back of your head. Be certain not to feel an aggressive stretch since this can cause headaches later. °· Hold for __________ seconds. °Repeat __________ times. Complete this exercise __________ times per day. °STRETCH - Cervical Side Bend  °· Stand or sit on a firm surface. Assume a good posture: chest up, shoulders drawn back, abdominal muscles slightly tense, knees unlocked (if standing) and feet hip width apart. °· Without letting your nose or shoulders move, slowly tip your right / left ear to your shoulder until your feel a gentle stretch in the muscles on the opposite side of your neck. °· Hold __________ seconds. °Repeat __________ times. Complete this exercise __________ times per day. °STRETCH - Cervical Rotators  °· Stand or sit on a firm surface. Assume a good posture: chest up, shoulders drawn back, abdominal muscles slightly tense, knees unlocked (if standing) and feet hip width apart. °· Keeping your eyes level with the ground, slowly turn your head until you feel a gentle stretch along the back and opposite side of your neck. °· Hold __________ seconds. °Repeat __________ times. Complete this exercise __________ times per day. °RANGE OF MOTION - Neck Circles  °· Stand or sit on a firm surface. Assume a good posture: chest up, shoulders drawn back, abdominal muscles slightly tense, knees unlocked (if standing) and feet hip width apart. °· Gently roll your head down and around from the   back of one shoulder to the back of the other. The motion should never be forced or painful.  Repeat the motion 10-20 times, or until you feel the neck muscles relax and loosen. Repeat __________ times. Complete the exercise __________ times per day. STRENGTHENING EXERCISES - Cervical Strain and Sprain These exercises may help you when beginning to rehabilitate your injury. They may  resolve your symptoms with or without further involvement from your physician, physical therapist, or athletic trainer. While completing these exercises, remember:   Muscles can gain both the endurance and the strength needed for everyday activities through controlled exercises.  Complete these exercises as instructed by your physician, physical therapist, or athletic trainer. Progress the resistance and repetitions only as guided.  You may experience muscle soreness or fatigue, but the pain or discomfort you are trying to eliminate should never worsen during these exercises. If this pain does worsen, stop and make certain you are following the directions exactly. If the pain is still present after adjustments, discontinue the exercise until you can discuss the trouble with your clinician. STRENGTH - Cervical Flexors, Isometric  Face a wall, standing about 6 inches away. Place a small pillow, a ball about 6-8 inches in diameter, or a folded towel between your forehead and the wall.  Slightly tuck your chin and gently push your forehead into the soft object. Push only with mild to moderate intensity, building up tension gradually. Keep your jaw and forehead relaxed.  Hold 10 to 20 seconds. Keep your breathing relaxed.  Release the tension slowly. Relax your neck muscles completely before you start the next repetition. Repeat __________ times. Complete this exercise __________ times per day. STRENGTH- Cervical Lateral Flexors, Isometric   Stand about 6 inches away from a wall. Place a small pillow, a ball about 6-8 inches in diameter, or a folded towel between the side of your head and the wall.  Slightly tuck your chin and gently tilt your head into the soft object. Push only with mild to moderate intensity, building up tension gradually. Keep your jaw and forehead relaxed.  Hold 10 to 20 seconds. Keep your breathing relaxed.  Release the tension slowly. Relax your neck muscles completely  before you start the next repetition. Repeat __________ times. Complete this exercise __________ times per day. STRENGTH - Cervical Extensors, Isometric   Stand about 6 inches away from a wall. Place a small pillow, a ball about 6-8 inches in diameter, or a folded towel between the back of your head and the wall.  Slightly tuck your chin and gently tilt your head back into the soft object. Push only with mild to moderate intensity, building up tension gradually. Keep your jaw and forehead relaxed.  Hold 10 to 20 seconds. Keep your breathing relaxed.  Release the tension slowly. Relax your neck muscles completely before you start the next repetition. Repeat __________ times. Complete this exercise __________ times per day. POSTURE AND BODY MECHANICS CONSIDERATIONS - Cervical Strain and Sprain Keeping correct posture when sitting, standing or completing your activities will reduce the stress put on different body tissues, allowing injured tissues a chance to heal and limiting painful experiences. The following are general guidelines for improved posture. Your physician or physical therapist will provide you with any instructions specific to your needs. While reading these guidelines, remember:  The exercises prescribed by your provider will help you have the flexibility and strength to maintain correct postures.  The correct posture provides the optimal environment for your joints to  work. All of your joints have less wear and tear when properly supported by a spine with good posture. This means you will experience a healthier, less painful body.  Correct posture must be practiced with all of your activities, especially prolonged sitting and standing. Correct posture is as important when doing repetitive low-stress activities (typing) as it is when doing a single heavy-load activity (lifting). PROLONGED STANDING WHILE SLIGHTLY LEANING FORWARD When completing a task that requires you to lean  forward while standing in one place for a long time, place either foot up on a stationary 2- to 4-inch high object to help maintain the best posture. When both feet are on the ground, the low back tends to lose its slight inward curve. If this curve flattens (or becomes too large), then the back and your other joints will experience too much stress, fatigue more quickly, and can cause pain.  RESTING POSITIONS Consider which positions are most painful for you when choosing a resting position. If you have pain with flexion-based activities (sitting, bending, stooping, squatting), choose a position that allows you to rest in a less flexed posture. You would want to avoid curling into a fetal position on your side. If your pain worsens with extension-based activities (prolonged standing, working overhead), avoid resting in an extended position such as sleeping on your stomach. Most people will find more comfort when they rest with their spine in a more neutral position, neither too rounded nor too arched. Lying on a non-sagging bed on your side with a pillow between your knees, or on your back with a pillow under your knees will often provide some relief. Keep in mind, being in any one position for a prolonged period of time, no matter how correct your posture, can still lead to stiffness. WALKING Walk with an upright posture. Your ears, shoulders, and hips should all line up. OFFICE WORK When working at a desk, create an environment that supports good, upright posture. Without extra support, muscles fatigue and lead to excessive strain on joints and other tissues. CHAIR:  A chair should be able to slide under your desk when your back makes contact with the back of the chair. This allows you to work closely.  The chair's height should allow your eyes to be level with the upper part of your monitor and your hands to be slightly lower than your elbows.  Body position:  Your feet should make contact with the  floor. If this is not possible, use a foot rest.  Keep your ears over your shoulders. This will reduce stress on your neck and low back. Document Released: 10/27/2005 Document Revised: 03/13/2014 Document Reviewed: 02/08/2009 [facility] Patient Information 2015 [place], [place]. This information is not intended to replace advice given to you by your health care provider. Make sure you discuss any questions you have with your health care provider.  Low Back Sprain with Rehab  A sprain is an injury in which a ligament is torn. The ligaments of the lower back are vulnerable to sprains. However, they are strong and require great force to be injured. These ligaments are important for stabilizing the spinal column. Sprains are classified into three categories. Grade 1 sprains cause pain, but the tendon is not lengthened. Grade 2 sprains include a lengthened ligament, due to the ligament being stretched or partially ruptured. With grade 2 sprains there is still function, although the function may be decreased. Grade 3 sprains involve a complete tear of the tendon or muscle, and  function is usually impaired. SYMPTOMS   Severe pain in the lower back.  Sometimes, a feeling of a "pop," "snap," or tear, at the time of injury.  Tenderness and sometimes swelling at the injury site.  Uncommonly, bruising (contusion) within 48 hours of injury.  Muscle spasms in the back. CAUSES  Low back sprains occur when a force is placed on the ligaments that is greater than they can handle. Common causes of injury include:  Performing a stressful act while off-balance.  Repetitive stressful activities that involve movement of the lower back.  Direct hit (trauma) to the lower back. RISK INCREASES WITH:  Contact sports (football, wrestling).  Collisions (major skiing accidents).  Sports that require throwing or lifting (baseball, weightlifting).  Sports involving twisting of the spine (gymnastics, diving, tennis,  golf).  Poor strength and flexibility.  Inadequate protection.  Previous back injury or surgery (especially fusion). PREVENTION  Wear properly fitted and padded protective equipment.  Warm up and stretch properly before activity.  Allow for adequate recovery between workouts.  Maintain physical fitness:  Strength, flexibility, and endurance.  Cardiovascular fitness.  Maintain a healthy body weight. PROGNOSIS  If treated properly, low back sprains usually heal with non-surgical treatment. The length of time for healing depends on the severity of the injury.  RELATED COMPLICATIONS   Recurring symptoms, resulting in a chronic problem.  Chronic inflammation and pain in the low back.  Delayed healing or resolution of symptoms, especially if activity is resumed too soon.  Prolonged impairment.  Unstable or arthritic joints of the low back. TREATMENT  Treatment first involves the use of ice and medicine, to reduce pain and inflammation. The use of strengthening and stretching exercises may help reduce pain with activity. These exercises may be performed at home or with a therapist. Severe injuries may require referral to a therapist for further evaluation and treatment, such as ultrasound. Your caregiver may advise that you wear a back brace or corset, to help reduce pain and discomfort. Often, prolonged bed rest results in greater harm then benefit. Corticosteroid injections may be recommended. However, these should be reserved for the most serious cases. It is important to avoid using your back when lifting objects. At night, sleep on your back on a firm mattress, with a pillow placed under your knees. If non-surgical treatment is unsuccessful, surgery may be needed.  MEDICATION   If pain medicine is needed, nonsteroidal anti-inflammatory medicines (aspirin and ibuprofen), or other minor pain relievers (acetaminophen), are often advised.  Do not take pain medicine for 7 days  before surgery.  Prescription pain relievers may be given, if your caregiver thinks they are needed. Use only as directed and only as much as you need.  Ointments applied to the skin may be helpful.  Corticosteroid injections may be given by your caregiver. These injections should be reserved for the most serious cases, because they may only be given a certain number of times. HEAT AND COLD  Cold treatment (icing) should be applied for 10 to 15 minutes every 2 to 3 hours for inflammation and pain, and immediately after activity that aggravates your symptoms. Use ice packs or an ice massage.  Heat treatment may be used before performing stretching and strengthening activities prescribed by your caregiver, physical therapist, or athletic trainer. Use a heat pack or a warm water soak. SEEK MEDICAL CARE IF:   Symptoms get worse or do not improve in 2 to 4 weeks, despite treatment.  You develop numbness or weakness  in either leg.  You lose bowel or bladder function.  Any of the following occur after surgery: fever, increased pain, swelling, redness, drainage of fluids, or bleeding in the affected area.  New, unexplained symptoms develop. (Drugs used in treatment may produce side effects.) EXERCISES  RANGE OF MOTION (ROM) AND STRETCHING EXERCISES - Low Back Sprain Most people with lower back pain will find that their symptoms get worse with excessive bending forward (flexion) or arching at the lower back (extension). The exercises that will help resolve your symptoms will focus on the opposite motion.  Your physician, physical therapist or athletic trainer will help you determine which exercises will be most helpful to resolve your lower back pain. Do not complete any exercises without first consulting with your caregiver. Discontinue any exercises which make your symptoms worse, until you speak to your caregiver. If you have pain, numbness or tingling which travels down into your buttocks, leg  or foot, the goal of the therapy is for these symptoms to move closer to your back and eventually resolve. Sometimes, these leg symptoms will get better, but your lower back pain may worsen. This is often an indication of progress in your rehabilitation. Be very alert to any changes in your symptoms and the activities in which you participated in the 24 hours prior to the change. Sharing this information with your caregiver will allow him or her to most efficiently treat your condition. These exercises may help you when beginning to rehabilitate your injury. Your symptoms may resolve with or without further involvement from your physician, physical therapist or athletic trainer. While completing these exercises, remember:   Restoring tissue flexibility helps normal motion to return to the joints. This allows healthier, less painful movement and activity.  An effective stretch should be held for at least 30 seconds.  A stretch should never be painful. You should only feel a gentle lengthening or release in the stretched tissue. FLEXION RANGE OF MOTION AND STRETCHING EXERCISES: STRETCH - Flexion, Single Knee to Chest   Lie on a firm bed or floor with both legs extended in front of you.  Keeping one leg in contact with the floor, bring your opposite knee to your chest. Hold your leg in place by either grabbing behind your thigh or at your knee.  Pull until you feel a gentle stretch in your low back. Hold __________ seconds.  Slowly release your grasp and repeat the exercise with the opposite side. Repeat __________ times. Complete this exercise __________ times per day.  STRETCH - Flexion, Double Knee to Chest  Lie on a firm bed or floor with both legs extended in front of you.  Keeping one leg in contact with the floor, bring your opposite knee to your chest.  Tense your stomach muscles to support your back and then lift your other knee to your chest. Hold your legs in place by either grabbing  behind your thighs or at your knees.  Pull both knees toward your chest until you feel a gentle stretch in your low back. Hold __________ seconds.  Tense your stomach muscles and slowly return one leg at a time to the floor. Repeat __________ times. Complete this exercise __________ times per day.  STRETCH - Low Trunk Rotation  Lie on a firm bed or floor. Keeping your legs in front of you, bend your knees so they are both pointed toward the ceiling and your feet are flat on the floor.  Extend your arms out to the side.  This will stabilize your upper body by keeping your shoulders in contact with the floor.  Gently and slowly drop both knees together to one side until you feel a gentle stretch in your low back. Hold for __________ seconds.  Tense your stomach muscles to support your lower back as you bring your knees back to the starting position. Repeat the exercise to the other side. Repeat __________ times. Complete this exercise __________ times per day  EXTENSION RANGE OF MOTION AND FLEXIBILITY EXERCISES: STRETCH - Extension, Prone on Elbows   Lie on your stomach on the floor, a bed will be too soft. Place your palms about shoulder width apart and at the height of your head.  Place your elbows under your shoulders. If this is too painful, stack pillows under your chest.  Allow your body to relax so that your hips drop lower and make contact more completely with the floor.  Hold this position for __________ seconds.  Slowly return to lying flat on the floor. Repeat __________ times. Complete this exercise __________ times per day.  RANGE OF MOTION - Extension, Prone Press Ups  Lie on your stomach on the floor, a bed will be too soft. Place your palms about shoulder width apart and at the height of your head.  Keeping your back as relaxed as possible, slowly straighten your elbows while keeping your hips on the floor. You may adjust the placement of your hands to maximize your  comfort. As you gain motion, your hands will come more underneath your shoulders.  Hold this position __________ seconds.  Slowly return to lying flat on the floor. Repeat __________ times. Complete this exercise __________ times per day.  RANGE OF MOTION- Quadruped, Neutral Spine   Assume a hands and knees position on a firm surface. Keep your hands under your shoulders and your knees under your hips. You may place padding under your knees for comfort.  Drop your head and point your tailbone toward the ground below you. This will round out your lower back like an angry cat. Hold this position for __________ seconds.  Slowly lift your head and release your tail bone so that your back sags into a large arch, like an old horse.  Hold this position for __________ seconds.  Repeat this until you feel

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
## 2015-11-20 ENCOUNTER — Emergency Department
Admission: EM | Admit: 2015-11-20 | Discharge: 2015-11-20 | Disposition: A | Discharge status: Home / Self Care | Payer: Self-pay | Attending: Emergency Medicine | Admitting: Emergency Medicine

## 2015-11-20 ENCOUNTER — Encounter: Payer: Self-pay | Admitting: *Deleted

## 2015-11-20 ENCOUNTER — Emergency Department: Payer: Self-pay

## 2015-11-20 DIAGNOSIS — Z79899 Other long term (current) drug therapy: Secondary | ICD-10-CM | POA: Insufficient documentation

## 2015-11-20 DIAGNOSIS — B9689 Other specified bacterial agents as the cause of diseases classified elsewhere: Secondary | ICD-10-CM

## 2015-11-20 DIAGNOSIS — R1031 Right lower quadrant pain: Secondary | ICD-10-CM

## 2015-11-20 DIAGNOSIS — Z7952 Long term (current) use of systemic steroids: Secondary | ICD-10-CM | POA: Insufficient documentation

## 2015-11-20 DIAGNOSIS — N760 Acute vaginitis: Secondary | ICD-10-CM | POA: Insufficient documentation

## 2015-11-20 DIAGNOSIS — R110 Nausea: Secondary | ICD-10-CM | POA: Insufficient documentation

## 2015-11-20 LAB — COMPREHENSIVE METABOLIC PANEL
ALK PHOS: 63 U/L (ref 38–126)
ALT: 15 U/L (ref 14–54)
ANION GAP: 8 (ref 5–15)
AST: 18 U/L (ref 15–41)
Albumin: 4.3 g/dL (ref 3.5–5.0)
BILIRUBIN TOTAL: 0.7 mg/dL (ref 0.3–1.2)
BUN: 13 mg/dL (ref 6–20)
CALCIUM: 9.3 mg/dL (ref 8.9–10.3)
CO2: 23 mmol/L (ref 22–32)
Chloride: 107 mmol/L (ref 101–111)
Creatinine, Ser: 0.71 mg/dL (ref 0.44–1.00)
Glucose, Bld: 110 mg/dL — ABNORMAL HIGH (ref 65–99)
Potassium: 3.7 mmol/L (ref 3.5–5.1)
SODIUM: 138 mmol/L (ref 135–145)
TOTAL PROTEIN: 7.3 g/dL (ref 6.5–8.1)

## 2015-11-20 LAB — WET PREP, GENITAL
SPERM: NONE SEEN
TRICH WET PREP: NONE SEEN
Yeast Wet Prep HPF POC: NONE SEEN

## 2015-11-20 LAB — URINALYSIS COMPLETE WITH MICROSCOPIC (ARMC ONLY)
Bilirubin Urine: NEGATIVE
GLUCOSE, UA: NEGATIVE mg/dL
Ketones, ur: NEGATIVE mg/dL
LEUKOCYTES UA: NEGATIVE
NITRITE: NEGATIVE
Protein, ur: NEGATIVE mg/dL
SPECIFIC GRAVITY, URINE: 1.001 — AB (ref 1.005–1.030)
pH: 6 (ref 5.0–8.0)

## 2015-11-20 LAB — CBC
HCT: 41 % (ref 35.0–47.0)
HEMOGLOBIN: 14 g/dL (ref 12.0–16.0)
MCH: 28.8 pg (ref 26.0–34.0)
MCHC: 34.2 g/dL (ref 32.0–36.0)
MCV: 84.2 fL (ref 80.0–100.0)
Platelets: 215 10*3/uL (ref 150–440)
RBC: 4.87 MIL/uL (ref 3.80–5.20)
RDW: 13.3 % (ref 11.5–14.5)
WBC: 6.5 10*3/uL (ref 3.6–11.0)

## 2015-11-20 LAB — CHLAMYDIA/NGC RT PCR (ARMC ONLY)
Chlamydia Tr: NOT DETECTED
N gonorrhoeae: NOT DETECTED

## 2015-11-20 LAB — LIPASE, BLOOD: LIPASE: 30 U/L (ref 11–51)

## 2015-11-20 MED ORDER — IOHEXOL 240 MG/ML SOLN
25.0000 mL | Freq: Once | INTRAMUSCULAR | Status: AC | PRN
  Administered 2015-11-20: 25 mL via ORAL

## 2015-11-20 MED ORDER — ACETAMINOPHEN 500 MG PO TABS
1000.0000 mg | ORAL_TABLET | Freq: Once | ORAL | Status: AC
  Administered 2015-11-20: 1000 mg via ORAL
  Filled 2015-11-20: qty 2

## 2015-11-20 MED ORDER — ONDANSETRON 4 MG PO TBDP: 4.0000 mg | ORAL_TABLET | Freq: Three times a day (TID) | ORAL | Status: DC | PRN

## 2015-11-20 MED ORDER — IOHEXOL 300 MG/ML  SOLN
100.0000 mL | Freq: Once | INTRAMUSCULAR | Status: AC | PRN
  Administered 2015-11-20: 100 mL via INTRAVENOUS

## 2015-11-20 MED ORDER — METRONIDAZOLE 500 MG PO TABS: 500.0000 mg | ORAL_TABLET | Freq: Two times a day (BID) | ORAL | Status: DC

## 2015-11-20 MED ORDER — METRONIDAZOLE 500 MG PO TABS
500.0000 mg | ORAL_TABLET | Freq: Once | ORAL | Status: AC
Start: 2015-11-20 — End: 2015-11-20
  Administered 2015-11-20: 500 mg via ORAL
  Filled 2015-11-20: qty 1

## 2015-11-20 NOTE — ED Provider Notes (Signed)
[facility] Emergency Department Provider Note  ____________________________________________  Time seen: Approximately 4:22 PM  I have reviewed the triage vital signs and the nursing notes.   HISTORY  Chief Complaint Abdominal Pain and Flank Pain    HPI Jillian Thomas is a 65 y.o. female status post remote cholecystectomy presenting with right lower quadrant pain.She reports that for the past 3 days she has had a progressively worsening right lower quadrant pain that was so severe woke her up from sleep at 4 AM today. She has had associated nausea without vomiting. She has had normal bowel movements, last BM this morning without diarrhea or blood. No fever, chills, urinary symptoms, change in vaginal discharge. She is not sexually active.   History reviewed. No pertinent past medical history.  There are no active problems to display for this patient.   Past Surgical History  Procedure Laterality Date  . Cholecystectomy    . Tubal ligation      Current Outpatient Rx  Name  Route  Sig  Dispense  Refill  . acetaminophen (TYLENOL) 500 MG tablet   Oral   Take 500-1,000 mg by mouth every 6 (six) hours as needed for moderate pain or headache.         . benzonatate (TESSALON) 100 MG capsule   Oral   Take 1 capsule (100 mg total) by mouth every 8 (eight) hours.   21 capsule   0   . cyclobenzaprine (FLEXERIL) 10 MG tablet   Oral   Take 1 tablet (10 mg total) by mouth every 8 (eight) hours as needed for muscle spasms.   30 tablet   1   . guaiFENesin-dextromethorphan (ROBITUSSIN DM) 100-10 MG/5ML syrup   Oral   Take 5 mLs by mouth every 4 (four) hours as needed for cough.         Marland Kitchen HYDROcodone-acetaminophen (NORCO) 5-325 MG per tablet   Oral   Take 1 tablet by mouth every 4 (four) hours as needed for moderate pain (or coughing).   10 tablet   0   . LORazepam (ATIVAN) 0.5 MG tablet   Oral   Take 1 tablet (0.5 mg total) by mouth every 8  (eight) hours as needed for anxiety or sleep.   15 tablet   0   . metroNIDAZOLE (FLAGYL) 500 MG tablet   Oral   Take 1 tablet (500 mg total) by mouth 2 (two) times daily.   14 tablet   0   . ondansetron (ZOFRAN ODT) 4 MG disintegrating tablet   Oral   Take 1 tablet (4 mg total) by mouth every 8 (eight) hours as needed for nausea or vomiting.   14 tablet   0   . predniSONE (DELTASONE) 20 MG tablet   Oral   Take 3 tablets (60 mg total) by mouth daily.   15 tablet   0   . traMADol (ULTRAM) 50 MG tablet   Oral   Take 1 tablet (50 mg total) by mouth every 6 (six) hours as needed.   20 tablet   0     Allergies Review of patient's allergies indicates no known allergies.  History reviewed. No pertinent family history.  Social History Social History  Substance Use Topics  . Smoking status: Never Smoker   . Smokeless tobacco: None  . Alcohol Use: No    Review of Systems Constitutional: No fever/chills. No lightheadedness or syncope. Eyes: No visual changes. ENT: No sore throat. Cardiovascular: Denies chest pain,  palpitations. Respiratory: Denies shortness of breath.  No cough. Gastrointestinal: Positive abdominal pain.  Positive nausea, no vomiting.  No diarrhea.  No constipation. Genitourinary: Negative for dysuria. No change in vaginal discharge. Musculoskeletal: Negative for back pain. Skin: Negative for rash. Neurological: Negative for headaches, focal weakness or numbness.  10-point ROS otherwise negative.  ____________________________________________   PHYSICAL EXAM:  VITAL SIGNS: ED Triage Vitals  Enc Vitals Group     BP 11/20/15 1449 137/80 mmHg     Pulse Rate 11/20/15 1449 77     Resp 11/20/15 1449 18     Temp 11/20/15 1449 98 F (36.7 C)     Temp Source 11/20/15 1449 Oral     SpO2 11/20/15 1449 97 %     Weight 11/20/15 1449 174 lb (78.926 kg)     Height 11/20/15 1449 5\' 3"  (1.6 m)     Head Cir --      Peak Flow --      Pain Score 11/20/15  1449 8     Pain Loc --      Pain Edu? --      Excl. in GC? --     Constitutional: Alert and oriented. Well appearing and in no acute distress. Answer question appropriately. Eyes: Conjunctivae are normal.  EOMI. no scleral icterus. Head: Atraumatic. Nose: No congestion/rhinnorhea. Mouth/Throat: Mucous membranes are moist.  Neck: No stridor.  Supple.   Cardiovascular: Normal rate, regular rhythm. No murmurs, rubs or gallops.  Respiratory: Normal respiratory effort.  No retractions. Lungs CTAB.  No wheezes, rales or ronchi. Gastrointestinal: Abdomen is obese, soft, nondistended. Patient has mild diffuse tenderness in the right lower quadrant without rebound, guarding or peritoneal signs.  Genitourinary: Normal-appearing external genitalia except two <6mm yellow filled lesions on right labia that are indurated w/o surrounding erythema or ttp. Vaginal exam with white yellow discharge, normal-appearing cervix, normal vaginal wall tissue. Bimanual exam is negative for CMT, adnexal tenderness to palpation, no palpable masses.  The pt does have pain w/ palpation to the right lower quadrant. Musculoskeletal: No LE edema.  Neurologic:  Normal speech and language. No gross focal neurologic deficits are appreciated.  Skin:  Skin is warm, dry and intact. No rash noted. Psychiatric: Mood and affect are normal. Speech and behavior are normal.  Normal judgement.  ____________________________________________   LABS (all labs ordered are listed, but only abnormal results are displayed)  Labs Reviewed  WET PREP, GENITAL - Abnormal; Notable for the following:    Clue Cells Wet Prep HPF POC FEW (*)    WBC, Wet Prep HPF POC MODERATE (*)    All other components within normal limits  COMPREHENSIVE METABOLIC PANEL - Abnormal; Notable for the following:    Glucose, Bld 110 (*)    All other components within normal limits  URINALYSIS COMPLETEWITH MICROSCOPIC ([facility] ONLY) - Abnormal; Notable for the following:     Color, Urine STRAW (*)    APPearance CLEAR (*)    Specific Gravity, Urine 1.001 (*)    Hgb urine dipstick 1+ (*)    Bacteria, UA RARE (*)    Squamous Epithelial / LPF 0-5 (*)    All other components within normal limits  CHLAMYDIA/NGC RT PCR ([facility] ONLY)  LIPASE, BLOOD  CBC   ____________________________________________  EKG  Not indicated ____________________________________________  RADIOLOGY  Ct Abdomen Pelvis W Contrast  11/20/2015  CLINICAL DATA:  Right lower quadrant abdominal pain and nausea. EXAM: CT ABDOMEN AND PELVIS WITH CONTRAST TECHNIQUE: Multidetector CT imaging of  the abdomen and pelvis was performed using the standard protocol following bolus administration of intravenous contrast. CONTRAST:  100mL OMNIPAQUE IOHEXOL 300 MG/ML  SOLN COMPARISON:  None. FINDINGS: The liver, pancreas, spleen, adrenal glands and kidneys are within normal limits. The gallbladder has been removed. There is no evidence of biliary ductal dilatation. The appendix is difficult to clearly identified. However, there is clearly no evidence to suggest appendicitis by CT. Diverticulosis of the colon noted without evidence of diverticulitis. Small bowel appears normal. No evidence of free fluid, abscess, mass or lymphadenopathy. The bladder, uterus and adnexal regions are unremarkable by CT. No hernias are seen. No vascular abnormalities. Probable small hiatal hernia. Bony structures are normal. Visualized lung bases are unremarkable. IMPRESSION: No acute findings by CT in the abdomen or pelvis. Electronically Signed   By: Irish LackGlenn  Yamagata M.D.   On: 11/20/2015 18:05    ____________________________________________   PROCEDURES  Procedure(s) performed: None  Critical Care performed: No ____________________________________________   INITIAL IMPRESSION / ASSESSMENT AND PLAN / ED COURSE  Pertinent labs & imaging results that were available during my care of the patient were reviewed by me  and considered in my medical decision making (see chart for details).  65 y.o. female presenting with right lower quadrant pain and nausea. Patient states she has had pain similar to this with vaginal infection before; she has not been having any change in vaginal discharge and is not sexually active so the likelihood of this is low unless she has BV or yeast infection. I will do a pelvic examination and send cultures and wet prep. I will also get a CAT scan to evaluate for appendicitis as the area of pain is localized both by patient report and on my exam. I will also initiate antibiotics and pain control, although I will not be able to give her narcotics as the patient wishes to drive home if her workup appears negative.  Patient CT scan did not show any acute process and her wet prep was positive for clue cells or have treated her for bacterial vaginosis. The patient was feeling better and was discharged home in stable condition. She understood return precautions as well as follow-up instructions.  ____________________________________________  FINAL CLINICAL IMPRESSION(S) / ED DIAGNOSES  Final diagnoses:  Bacterial vaginosis  Right lower quadrant pain  Nausea without vomiting      NEW MEDICATIONS STARTED DURING THIS VISIT:  Discharge Medication List as of 11/20/2015  6:25 PM    START taking these medications   Details  metroNIDAZOLE (FLAGYL) 500 MG tablet Take 1 tablet (500 mg total) by mouth 2 (two) times daily., Starting 11/20/2015, Until Discontinued, Print    ondansetron (ZOFRAN ODT) 4 MG disintegrating tablet Take 1 tablet (4 mg total) by mouth every 8 (eight) hours as needed for nausea or vomiting., Starting 11/20/2015, Until Discontinued, Print         Rockne MenghiniAnne-Caroline Parvin Stetzer, MD 11/20/15 2359

## 2015-11-20 NOTE — Discharge Instructions (Signed)
Please take the entire course of antibiotics even if you're feeling better.  Drink plenty of fluids stay well-hydrated. Return to the emergency department if you develop severe pain, fever, inability to keep down fluids, or any other symptoms concerning to you.  Bacterial Vaginosis Bacterial vaginosis is a vaginal infection that occurs when the normal balance of bacteria in the vagina is disrupted. It results from an overgrowth of certain bacteria. This is the most common vaginal infection in women of childbearing age. Treatment is important to prevent complications, especially in pregnant women, as it can cause a premature delivery. CAUSES  Bacterial vaginosis is caused by an increase in harmful bacteria that are normally present in smaller amounts in the vagina. Several different kinds of bacteria can cause bacterial vaginosis. However, the reason that the condition develops is not fully understood. RISK FACTORS Certain activities or behaviors can put you at an increased risk of developing bacterial vaginosis, including:  Having a new sex partner or multiple sex partners.  Douching.  Using an intrauterine device (IUD) for contraception. Women do not get bacterial vaginosis from toilet seats, bedding, swimming pools, or contact with objects around them. SIGNS AND SYMPTOMS  Some women with bacterial vaginosis have no signs or symptoms. Common symptoms include:  Grey vaginal discharge.  A fishlike odor with discharge, especially after sexual intercourse.  Itching or burning of the vagina and vulva.  Burning or pain with urination. DIAGNOSIS  Your health care provider will take a medical history and examine the vagina for signs of bacterial vaginosis. A sample of vaginal fluid may be taken. Your health care provider will look at this sample under a microscope to check for bacteria and abnormal cells. A vaginal pH test may also be done.  TREATMENT  Bacterial vaginosis may be treated with  antibiotic medicines. These may be given in the form of a pill or a vaginal cream. A second round of antibiotics may be prescribed if the condition comes back after treatment. Because bacterial vaginosis increases your risk for sexually transmitted diseases, getting treated can help reduce your risk for chlamydia, gonorrhea, HIV, and herpes. HOME CARE INSTRUCTIONS   Only take over-the-counter or prescription medicines as directed by your health care provider.  If antibiotic medicine was prescribed, take it as directed. Make sure you finish it even if you start to feel better.  Tell all sexual partners that you have a vaginal infection. They should see their health care provider and be treated if they have problems, such as a mild rash or itching.  During treatment, it is important that you follow these instructions:  Avoid sexual activity or use condoms correctly.  Do not douche.  Avoid alcohol as directed by your health care provider.  Avoid breastfeeding as directed by your health care provider. SEEK MEDICAL CARE IF:   Your symptoms are not improving after 3 days of treatment.  You have increased discharge or pain.  You have a fever. MAKE SURE YOU:   Understand these instructions.  Will watch your condition.  Will get help right away if you are not doing well or get worse. FOR MORE INFORMATION  Centers for Disease Control and Prevention, Division of STD Prevention: SolutionApps.co.zawww.cdc.gov/std American Sexual Health Association (ASHA): www.ashastd.org    This information is not intended to replace advice given to you by your health care provider. Make sure you discuss any questions you have with your health care provider.   Document Released: 10/27/2005 Document Revised: 11/17/2014 Document Reviewed: 06/08/2013 Elsevier  Interactive Patient Education ©2016 Elsevier Inc. ° °

## 2015-11-20 NOTE — ED Notes (Signed)
Pt states RLQ abd pain with nausea since Sunday, pt states she has been able to eat and drink, pt in no acute distress

## 2016-01-15 ENCOUNTER — Emergency Department: Payer: Self-pay

## 2016-01-15 ENCOUNTER — Emergency Department
Admission: EM | Admit: 2016-01-15 | Discharge: 2016-01-15 | Disposition: A | Discharge status: Home / Self Care | Payer: Self-pay | Attending: Emergency Medicine | Admitting: Emergency Medicine

## 2016-01-15 ENCOUNTER — Encounter: Payer: Self-pay | Admitting: Emergency Medicine

## 2016-01-15 DIAGNOSIS — R079 Chest pain, unspecified: Secondary | ICD-10-CM

## 2016-01-15 DIAGNOSIS — Z79899 Other long term (current) drug therapy: Secondary | ICD-10-CM | POA: Insufficient documentation

## 2016-01-15 DIAGNOSIS — Z7952 Long term (current) use of systemic steroids: Secondary | ICD-10-CM | POA: Insufficient documentation

## 2016-01-15 DIAGNOSIS — I493 Ventricular premature depolarization: Secondary | ICD-10-CM | POA: Insufficient documentation

## 2016-01-15 LAB — CBC WITH DIFFERENTIAL/PLATELET
BASOS ABS: 0 10*3/uL (ref 0–0.1)
Basophils Relative: 0 %
Eosinophils Absolute: 0.1 10*3/uL (ref 0–0.7)
Eosinophils Relative: 2 %
HCT: 40.1 % (ref 35.0–47.0)
Hemoglobin: 13.8 g/dL (ref 12.0–16.0)
LYMPHS ABS: 1.9 10*3/uL (ref 1.0–3.6)
Lymphocytes Relative: 26 %
MCH: 29.5 pg (ref 26.0–34.0)
MCHC: 34.3 g/dL (ref 32.0–36.0)
MCV: 85.9 fL (ref 80.0–100.0)
MONOS PCT: 6 %
Monocytes Absolute: 0.4 10*3/uL (ref 0.2–0.9)
NEUTROS ABS: 4.9 10*3/uL (ref 1.4–6.5)
NEUTROS PCT: 66 %
PLATELETS: 211 10*3/uL (ref 150–440)
RBC: 4.67 MIL/uL (ref 3.80–5.20)
RDW: 13.4 % (ref 11.5–14.5)
WBC: 7.4 10*3/uL (ref 3.6–11.0)

## 2016-01-15 LAB — BASIC METABOLIC PANEL
ANION GAP: 10 (ref 5–15)
BUN: 14 mg/dL (ref 6–20)
CHLORIDE: 106 mmol/L (ref 101–111)
CO2: 24 mmol/L (ref 22–32)
CREATININE: 0.74 mg/dL (ref 0.44–1.00)
Calcium: 9.1 mg/dL (ref 8.9–10.3)
GFR calc non Af Amer: >60 mL/min (ref >60)
GLUCOSE: 117 mg/dL — AB (ref 65–99)
Potassium: 3.7 mmol/L (ref 3.5–5.1)
Sodium: 140 mmol/L (ref 135–145)

## 2016-01-15 LAB — TROPONIN I: Troponin I: <0.03 ng/mL (ref <0.031)

## 2016-01-15 LAB — MAGNESIUM: Magnesium: 2 mg/dL (ref 1.7–2.4)

## 2016-01-15 MED ORDER — METOPROLOL TARTRATE 1 MG/ML IV SOLN
2.5000 mg | Freq: Once | INTRAVENOUS | Status: AC
  Administered 2016-01-15: 2.5 mg via INTRAVENOUS
  Filled 2016-01-15: qty 5

## 2016-01-15 MED ORDER — LORAZEPAM 2 MG/ML IJ SOLN
0.5000 mg | Freq: Once | INTRAMUSCULAR | Status: AC
  Administered 2016-01-15: 0.5 mg via INTRAVENOUS
  Filled 2016-01-15: qty 1

## 2016-01-15 MED ORDER — SODIUM CHLORIDE 0.9 % IV BOLUS (SEPSIS)
1000.0000 mL | Freq: Once | INTRAVENOUS | Status: AC
  Administered 2016-01-15: 1000 mL via INTRAVENOUS

## 2016-01-15 MED ORDER — PROPRANOLOL HCL 10 MG PO TABS: 10.0000 mg | ORAL_TABLET | Freq: Two times a day (BID) | ORAL | Status: DC

## 2016-01-15 NOTE — Discharge Instructions (Signed)
Nonspecific Chest Pain It is often hard to find the cause of chest pain. There is always a chance that your pain could be related to something serious, such as a heart attack or a blood clot in your lungs. Chest pain can also be caused by conditions that are not life-threatening. If you have chest pain, it is very important to follow up with your doctor.  HOME CARE  If you were prescribed an antibiotic medicine, finish it all even if you start to feel better.  Avoid any activities that cause chest pain.  Do not use any tobacco products, including cigarettes, chewing tobacco, or electronic cigarettes. If you need help quitting, ask your doctor.  Do not drink alcohol.  Take medicines only as told by your doctor.  Keep all follow-up visits as told by your doctor. This is important. This includes any further testing if your chest pain does not go away.  Your doctor may tell you to keep your head raised (elevated) while you sleep.  Make lifestyle changes as told by your doctor. These may include:  Getting regular exercise. Ask your doctor to suggest some activities that are safe for you.  Eating a heart-healthy diet. Your doctor or a diet specialist (dietitian) can help you to learn healthy eating options.  Maintaining a healthy weight.  Managing diabetes, if necessary.  Reducing stress. GET HELP IF:  Your chest pain does not go away, even after treatment.  You have a rash with blisters on your chest.  You have a fever. GET HELP RIGHT AWAY IF:  Your chest pain is worse.  You have an increasing cough, or you cough up blood.  You have severe belly (abdominal) pain.  You feel extremely weak.  You pass out (faint).  You have chills.  You have sudden, unexplained chest discomfort.  You have sudden, unexplained discomfort in your arms, back, neck, or jaw.  You have shortness of breath at any time.  You suddenly start to sweat, or your skin gets clammy.  You feel  nauseous.  You vomit.  You suddenly feel light-headed or dizzy.  Your heart begins to beat quickly, or it feels like it is skipping beats. These symptoms may be an emergency. Do not wait to see if the symptoms will go away. Get medical help right away. Call your local emergency services (911 in the [place] Do not drive yourself to the hospital.   This information is not intended to replace advice given to you by your health care provider. Make sure you discuss any questions you have with your health care provider.   Document Released: 04/14/2008 Document Revised: 11/17/2014 Document Reviewed: 06/02/2014 Elsevier Interactive Patient Education 2016 Elsevier Inc.  Premature Ventricular Contraction A premature ventricular contraction is an irregularity in the normal heart rhythm. These contractions are extra heartbeats that occur too early in the normal sequence. In most cases, these contractions are harmless and do not require treatment. CAUSES Premature ventricular contractions may occur without a known cause. In healthy people, the extra contractions may be caused by:  Smoking.  Drinking alcohol.  Caffeine.  Certain medicines.  Some illegal drugs.  Stress. Sometimes, changes in chemicals in the blood (electrolytes) can also cause premature ventricular contractions. They can also occur in people with heart diseases that cause a decrease in blood flow to the heart. SIGNS AND SYMPTOMS Premature ventricular contractions often do not cause any symptoms. In some cases, you may have a feeling of your heart beating fast or skipping  a beat (palpitations). DIAGNOSIS Your health care provider will take your medical history and do a physical exam. During the exam, the health care provider will check for irregular heartbeats. Various tests may be done to help diagnose premature ventricular contractions. These tests may include:  An ECG (electrocardiogram) to monitor the electrical activity of  your heart.  Holter monitor testing. A Holter monitor is a portable device that can monitor the electrical activity of your heart over longer periods of time.  Stress tests to see how exercise affects your heart rhythm.  Echocardiogram. This test uses sound waves (ultrasound) to produce an image of your heart.  Electrophysiology study. This is used to evaluate the electrical conduction system of your heart. TREATMENT Usually, no treatment is needed. You may be advised to avoid things that can trigger the premature contractions, such as caffeine or alcohol. Medicines are sometimes given if symptoms are severe or if the extra heartbeats are very frequent. Treatment may also be needed for an underlying cause of the contractions if one is found. HOME CARE INSTRUCTIONS  Take medicines only as directed by your health care provider.  Make any lifestyle changes recommended by your health care provider. These may include:  Quitting smoking.  Avoiding or limiting caffeine or alcohol.  Exercising. Talk to your health care provider about what type of exercise is safe for you.  Trying to reduce stress.  Keep all follow-up visits with your health care provider. This is important. SEEK IMMEDIATE MEDICAL CARE IF:  You feel palpitations that are frequent or continual.  You have chest pain.  You have shortness of breath.  You have sweating for no reason.  You have nausea and vomiting.  You become light-headed or faint.   This information is not intended to replace advice given to you by your health care provider. Make sure you discuss any questions you have with your health care provider.   Document Released: 06/13/2004 Document Revised: 11/17/2014 Document Reviewed: 03/30/2014 Elsevier Interactive Patient Education [employer]2016 Elsevier Inc.

## 2016-01-15 NOTE — ED Provider Notes (Signed)
Time Seen: Approximately 1850  I have reviewed the triage notes  Chief Complaint: Chest Pain   History of Present Illness: Jillian Thomas is a 65 y.o. female who presents with some chest discomfort that started 3 hours prior to arrival. Patient states that she was at home and was doing routine housework etc. started feeling lightheaded and some mild substernal chest discomfort and some feelings of heart palpitations. Patient denies any syncopal episode. She does describe some mild nausea no vomiting. She describes some mild shortness of breath with a pleuritic component. Patient was transported here by EMS with aspirin and nitroglycerin. She noticed some slightly relief with the nitroglycerin but still feels palpitations and chest discomfort. Niacin any new medications. She denies any pulmonary emboli risk factors  Patient has no cardiac risk factors. Negative family history. No history of hypertension, diabetes, or high cholesterol. She denies any pulmonary emboli risk factors History reviewed. No pertinent past medical history.  There are no active problems to display for this patient.   Past Surgical History  Procedure Laterality Date  . Cholecystectomy    . Tubal ligation      Past Surgical History  Procedure Laterality Date  . Cholecystectomy    . Tubal ligation      Current Outpatient Rx  Name  Route  Sig  Dispense  Refill  . acetaminophen (TYLENOL) 500 MG tablet   Oral   Take 500-1,000 mg by mouth every 6 (six) hours as needed for moderate pain or headache.         . benzonatate (TESSALON) 100 MG capsule   Oral   Take 1 capsule (100 mg total) by mouth every 8 (eight) hours.   21 capsule   0   . cyclobenzaprine (FLEXERIL) 10 MG tablet   Oral   Take 1 tablet (10 mg total) by mouth every 8 (eight) hours as needed for muscle spasms.   30 tablet   1   . guaiFENesin-dextromethorphan (ROBITUSSIN DM) 100-10 MG/5ML syrup   Oral   Take 5 mLs by mouth every 4  (four) hours as needed for cough.         Marland Kitchen HYDROcodone-acetaminophen (NORCO) 5-325 MG per tablet   Oral   Take 1 tablet by mouth every 4 (four) hours as needed for moderate pain (or coughing).   10 tablet   0   . LORazepam (ATIVAN) 0.5 MG tablet   Oral   Take 1 tablet (0.5 mg total) by mouth every 8 (eight) hours as needed for anxiety or sleep.   15 tablet   0   . metroNIDAZOLE (FLAGYL) 500 MG tablet   Oral   Take 1 tablet (500 mg total) by mouth 2 (two) times daily.   14 tablet   0   . ondansetron (ZOFRAN ODT) 4 MG disintegrating tablet   Oral   Take 1 tablet (4 mg total) by mouth every 8 (eight) hours as needed for nausea or vomiting.   14 tablet   0   . predniSONE (DELTASONE) 20 MG tablet   Oral   Take 3 tablets (60 mg total) by mouth daily.   15 tablet   0   . traMADol (ULTRAM) 50 MG tablet   Oral   Take 1 tablet (50 mg total) by mouth every 6 (six) hours as needed.   20 tablet   0     Allergies:  Review of patient's allergies indicates no known allergies.  Family History: No family history on  file.  Social History: Social History  Substance Use Topics  . Smoking status: Never Smoker   . Smokeless tobacco: None  . Alcohol Use: No     Review of Systems:   10 point review of systems was performed and was otherwise negative:  Constitutional: No fever Eyes: No visual disturbances ENT: No sore throat, ear pain Cardiac: No chest pain Respiratory: No shortness of breath, wheezing, or stridor Abdomen: No abdominal pain, no vomiting, No diarrhea Endocrine: No weight loss, No night sweats Extremities: No peripheral edema, cyanosis Skin: No rashes, easy bruising Neurologic: No focal weakness, trouble with speech or swollowing Urologic: No dysuria, Hematuria, or urinary frequency   Physical Exam:  ED Triage Vitals  Enc Vitals Group     BP 01/15/16 1829 114/67 mmHg     Pulse Rate 01/15/16 1829 86     Resp 01/15/16 1829 16     Temp --      Temp  Source 01/15/16 1829 Oral     SpO2 01/15/16 1829 100 %     Weight --      Height --      Head Cir --      Peak Flow --      Pain Score 01/15/16 1824 5     Pain Loc --      Pain Edu? --      Excl. in GC? --     General: Awake , Alert , and Oriented times 3; GCS 15 Head: Normal cephalic , atraumatic Eyes: Pupils equal , round, reactive to light Nose/Throat: No nasal drainage, patent upper airway without erythema or exudate.  Neck: Supple, Full range of motion, No anterior adenopathy or palpable thyroid masses Lungs: Clear to ascultation without wheezes , rhonchi, or rales Heart: Regular rate, irregular rhythm without murmurs , gallops , or rubs Abdomen: Soft, non tender without rebound, guarding , or rigidity; bowel sounds positive and symmetric in all 4 quadrants. No organomegaly .        Extremities: 2 plus symmetric pulses. No edema, clubbing or cyanosis Neurologic: normal ambulation, Motor symmetric without deficits, sensory intact Skin: warm, dry, no rashes Patient does have some reproducible pain in the left sternal border  Labs:   All laboratory work was reviewed including any pertinent negatives or positives listed below:  Labs Reviewed  BASIC METABOLIC PANEL  CBC WITH DIFFERENTIAL/PLATELET  MAGNESIUM  TROPONIN I   patient's laboratory work including serial troponins was negative  EKG: ED ECG REPORT I, Jennye Moccasin, the attending physician, personally viewed and interpreted this ECG.  Date: 01/15/2016 EKG Time: 1828  Rate: 78 Rhythm: normal sinus rhythm with ventricular trigeminy QRS Axis: normal Intervals: normal ST/T Wave abnormalities: normal Conduction Disturbances: none Narrative Interpretation: unremarkable    Radiology:    CLINICAL DATA: Chest pain and heart palpitations since 4 p.m. today.  EXAM: CHEST 2 VIEW  COMPARISON: 02/22/2014  FINDINGS: The heart size and mediastinal contours are within normal limits. Both lungs are clear. No  pleural effusion or pneumothorax. The visualized skeletal structures are unremarkable.  IMPRESSION: No active cardiopulmonary disease.   Electronically Signed By: Amie Portland M.D. On: 01/15/2016 19:00   I personally reviewed the radiologic studies   P ED Course: Differential includes all life-threatening causes for chest pain. This includes but is not exclusive to acute coronary syndrome, aortic dissection, pulmonary embolism, cardiac tamponade, community-acquired pneumonia, pericarditis, musculoskeletal chest wall pain, etc. Given the patient's current clinical presentation and objective findings I felt this  was unlikely to be unstable angina or acute coronary syndrome. Patient did have some trigeminy per EKG and monitor. She was given fluid bolus along with Ativan half a milligram and Lopressor 2.5 mg IV. With resolution of her PVCs the patient had symptomatic relief. She has no risk factors for coronary artery disease. Patient was prescribed low-dose propranolol and an outpatient basis and was referred to cardiology unassigned. She was advised to workup is not complete, but I felt that she did not require inpatient hospitalization at this time.*    Assessment:  Acute unspecified chest pain Heart palpitations PVCs      Plan:  Outpatient management Patient was advised to return immediately if condition worsens. Patient was advised to follow up with their primary care physician or other specialized physicians involved in their outpatient care             Jennye MoccasinBrian S Shaquilla Kehres, MD 01/15/16 2336

## 2016-01-15 NOTE — ED Notes (Signed)
Pt comes from home EMS for chest pain x1600. Pt c/o palpitations. Pt had 2 nitro, Asprin,  en route with relief. VS stable

## 2016-01-17 ENCOUNTER — Telehealth: Payer: Self-pay | Admitting: Cardiovascular Disease

## 2016-01-17 NOTE — Telephone Encounter (Signed)
Spoke with daughter lmov to call back and schedule and ED fu from 01/16/16

## 2016-02-05 ENCOUNTER — Ambulatory Visit (INDEPENDENT_AMBULATORY_CARE_PROVIDER_SITE_OTHER): Payer: Self-pay | Admitting: Cardiology

## 2016-02-05 ENCOUNTER — Encounter: Payer: Self-pay | Admitting: Cardiology

## 2016-02-05 VITALS — BP 114/70 | HR 70 | Ht 63.0 in | Wt 197.1 lb

## 2016-02-05 DIAGNOSIS — R002 Palpitations: Secondary | ICD-10-CM

## 2016-02-05 DIAGNOSIS — I493 Ventricular premature depolarization: Secondary | ICD-10-CM

## 2016-02-05 DIAGNOSIS — R0602 Shortness of breath: Secondary | ICD-10-CM

## 2016-02-05 DIAGNOSIS — E669 Obesity, unspecified: Secondary | ICD-10-CM

## 2016-02-05 DIAGNOSIS — R079 Chest pain, unspecified: Secondary | ICD-10-CM

## 2016-02-05 MED ORDER — NITROGLYCERIN 0.4 MG SL SUBL: 0.4000 mg | SUBLINGUAL_TABLET | SUBLINGUAL | Status: DC | PRN

## 2016-02-05 NOTE — Progress Notes (Signed)
Cardiology Office Note   Date:  02/05/2016   ID:  Jillian Thomas, DOB 08-23-1951, MRN [id number]  Referring Doctor:  Emogene Morgan, MD   Cardiologist:   Almond Lint, MD   Reason for consultation:  Chief Complaint  Patient presents with  . New Patient (Initial Visit)    no swelling or sob. no other complaints  . Chest Pain      History of Present Illness: Jillian Thomas is a 65 y.o. female who presents for Evaluation of chest pain, palpitations, shortness of breath.  She presented to the ER 2 weeks ago for sudden onset of chest pain, palpitations. Chest pain was described as sharp in the center of the chest, nonradiating at that time, 7-8 out of 10 in severity, sudden onset, lasted 3 hours before it was relieved with nitroglycerin in the ER. This happened around 4 in the afternoon. She felt very sick and almost passed out when she got to the ER. This was associated with palpitations, that she describes as feeling her heart beating very fast. Also associated with shortness of breath.  Other episode of chest pain, sometimes radiating down to the left arm with a left arm feeling numb and cold.  Patient also reports shortness of breath for over a year year now. Last summer she was able to walk 3-4 miles almost every day. Currently, unable to do so without getting significant shortness of breath. No chest pain with walking.  Otherwise, patient denies headache, fever, cough, colds, but on pain, orthopnea, PND, edema. No bleeding issues.   ROS:  Please see the history of present illness. Aside from mentioned under HPI, all other systems are reviewed and negative.     History reviewed. No pertinent past medical history.  Past Surgical History  Procedure Laterality Date  . Cholecystectomy    . Tubal ligation       reports that she has never smoked. She does not have any smokeless tobacco history on file. She reports that she does not drink alcohol or use illicit drugs.    family history is not on file.  Brother's daughter died suddenly at age 12. 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 Cousin died of questionable MI at age 59.  Current Outpatient Prescriptions  Medication Sig Dispense Refill  . nitroGLYCERIN (NITROSTAT) 0.4 MG SL tablet Place 1 tablet (0.4 mg total) under the tongue every 5 (five) minutes as needed for chest pain. 25 tablet 6  . propranolol (INDERAL) 10 MG tablet Take 1 tablet (10 mg total) by mouth 2 (two) times daily. (Patient not taking: Reported on 02/05/2016) 28 tablet 0   No current facility-administered medications for this visit.  Patient is not taking any medications currently  Allergies: Review of patient's allergies indicates no known allergies.    PHYSICAL EXAM: VS:  BP 114/70 mmHg  Pulse 70  Ht  (1.6 m)  Wt 197 lb 1.9 oz (89.413 kg)  BMI 34.93 kg/m2 , Body mass index is 34.93 kg/(m^2). Wt Readings from Last 3 Encounters:  02/05/16 197 lb 1.9 oz (89.413 kg)  11/20/15 174 lb (78.926 kg)  07/24/15 180 lb (81.647 kg)    GENERAL:  well developed, well nourished,  obese, not in acute distress HEENT: normocephalic, pink conjunctivae, anicteric sclerae, no xanthelasma, normal dentition, oropharynx clear NECK:  no neck vein engorgement, JVP normal, no hepatojugular reflux, carotid upstroke brisk and symmetric, no bruit, no  thyromegaly, no lymphadenopathy LUNGS:  good respiratory effort, clear to auscultation bilaterally CV:  PMI not displaced, no thrills, no lifts, S1 and S2 within normal limits, no palpable S3 or S4, no murmurs, no rubs, no gallops ABD:  Soft, nontender, nondistended, normoactive bowel sounds, no abdominal aortic bruit, no hepatomegaly, no splenomegaly MS: nontender back, no kyphosis, no scoliosis, no joint deformities EXT:  2+ DP/PT pulses, no edema, no varicosities, no cyanosis, no clubbing SKIN: warm, nondiaphoretic, normal turgor, no  ulcers NEUROPSYCH: alert, oriented to person, place, and time, sensory/motor grossly intact, normal mood, appropriate affect  Recent Labs: 11/20/2015: ALT 15 01/15/2016: BUN 14; Creatinine, Ser 0.74; Hemoglobin 13.8; Magnesium 2.0; Platelets 211; Potassium 3.7; Sodium 140   Lipid Panel No results found for: CHOL, TRIG, HDL, CHOLHDL, VLDL, LDLCALC, LDLDIRECT  Lipid panel 01/29/2016: TC 214 TG 169 HDL 46 LDL 134  Other studies Reviewed:  EKG:  EKG is ordered today. The ekg from 01/28/2016 was personally reviewed by me and it revealed sinus rhythm, 70 BPM. Occasional PVCs.  Additional studies/ records that were reviewed personally reviewed by me today include: None available   ASSESSMENT AND PLAN:  Chest pain She had severe chest pain 2 weeks ago. CAD risk factors include age, postmenopausal state, possible history of premature CAD in the family, question hyperlipidemia. Recommend further evaluation with exercise nuclear stress test, modified Bruce protocol. Recommend echocardiogram. Patient advised to continue taking ASA, and prescribed NTG SL prn for chest pain. Patient instructed to call [phone] for unrelenting chest pain.   Shortness of breath Similar workup as above, exercise nuclear stress is, echocardiogram  Palpitations PVCs noted on EKG Recommend 24-hour Holter monitor for further evaluation BMP and CBC within normal limits from 01/15/2016 ER visit TSH within normal limits from 01/29/2016  Obesity Body mass index is 34.93 kg/(m^2).Marland Kitchen. Recommend aggressive weight loss through diet and increased physical activity, after heart attack workup was done.   Current medicines are reviewed at length with the patient today.  The patient does not have concerns regarding medicines.  Labs/ tests ordered today include:  Orders Placed This Encounter  Procedures  . NM Myocar Multi W/Spect W/Wall Motion / EF  . Holter monitor - 24 hour  . EKG 12-Lead  . ECHOCARDIOGRAM COMPLETE    I  had a lengthy and detailed discussion with the patient regarding diagnoses, prognosis, diagnostic options, treatment options, and side effects of medications.   I counseled the patient on importance of lifestyle modification including heart healthy diet, regular physical activity.   Disposition:   FU with undersigned after tests   Signed, Almond LintAileen Montgomery Rothlisberger, MD  02/05/2016 11:08 AM    [facility]

## 2016-02-05 NOTE — Patient Instructions (Addendum)
Medication Instructions:  Your physician has recommended you make the following change in your medication: Nitroglycerin under the tongue one tablet every 5 minutes for chest pain up to a total of 3. Then call 911 or go to the closest emergency room.    Labwork: None ordered  Testing/Procedures: Your physician has requested that you have an echocardiogram. Echocardiography is a painless test that uses sound waves to create images of your heart. It provides your doctor with information about the size and shape of your heart and how well your heart's chambers and valves are working. This procedure takes approximately one hour. There are no restrictions for this procedure.  Date & Time:______________________________________________________________  Your physician has recommended that you wear a holter monitor. Holter monitors are medical devices that record the heart's electrical activity. Doctors most often use these monitors to diagnose arrhythmias. Arrhythmias are problems with the speed or rhythm of the heartbeat. The monitor is a small, portable device. You can wear one while you do your normal daily activities. This is usually used to diagnose what is causing palpitations/syncope (passing out).  Date & Time:______________________________________________________________  Your physician has requested that you have en exercise stress myoview. For further information please visit [email or web address] Please follow instruction sheet, as given.  Date & Time: ____Tuesday February 12, 2016 at 08:00 AM ___________________________________  Follow-Up: Your physician recommends that you schedule a follow-up appointment after testing to review results.  Date & Time:_____________________________________________________________   Any Other Special Instructions Will Be Listed Below (If Applicable). [facility] MYOVIEW  Your caregiver has ordered a Stress Test with nuclear imaging. The purpose of this test is to  evaluate the blood supply to your heart muscle. This procedure is referred to as a "Non-Invasive Stress Test." This is because other than having an IV started in your vein, nothing is inserted or "invades" your body. Cardiac stress tests are done to find areas of poor blood flow to the heart by determining the extent of coronary artery disease (CAD). Some patients exercise on a treadmill, which naturally increases the blood flow to your heart, while others who are  unable to walk on a treadmill due to physical limitations have a pharmacologic/chemical stress agent called Lexiscan . This medicine will mimic walking on a treadmill by temporarily increasing your coronary blood flow.   Please note: these test may take anywhere between 2-4 hours to complete  PLEASE REPORT TO [facility]TR[facility]  THE VOLUNTEERS AT THE FIRST DESK WILL DIRECT YOU WHERE TO GO  Date of Procedure:___Tuesday February 12, 2016 at 08:00 AM______________  Arrival Time for Procedure:_____Arrive at 07:45 AM___________  Instructions regarding medication:   Hold your medications the morning of your test.    PLEASE NOTIFY THE OFFICE AT LEAST 24 HOURS IN ADVANCE IF YOU ARE UNABLE TO KEEP YOUR APPOINTMENT.  [phone] AND  PLEASE NOTIFY NUCLEAR MEDICINE AT [facility] AT LEAST 24 HOURS IN ADVANCE IF YOU ARE UNABLE TO KEEP YOUR APPOINTMENT. [phone]  How to prepare for your Myoview test:   Do not eat or drink after midnight  No caffeine for 24 hours prior to test  No smoking 24 hours prior to test.  Your medication may be taken with water.  If your doctor stopped a medication because of this test, do not take that medication.  Ladies, please do not wear dresses.  Skirts or pants are appropriate. Please wear a short sleeve shirt.  No perfume, cologne or lotion.  Wear comfortable walking shoes. No heels!  If you need a refill on your cardiac medications before your next appointment, please call  your pharmacy.  Echocardiogram An echocardiogram, or echocardiography, uses sound waves (ultrasound) to produce an image of your heart. The echocardiogram is simple, painless, obtained within a short period of time, and offers valuable information to your health care provider. The images from an echocardiogram can provide information such as:  Evidence of coronary artery disease (CAD).  Heart size.  Heart muscle function.  Heart valve function.  Aneurysm detection.  Evidence of a past heart attack.  Fluid buildup around the heart.  Heart muscle thickening.  Assess heart valve function. [facility] PROVIDER KNOW ABOUT:  Any allergies you have.  All medicines you are taking, including vitamins, herbs, eye drops, creams, and over-the-counter medicines.  Previous problems you or members of your family have had with the use of anesthetics.  Any blood disorders you have.  Previous surgeries you have had.  Medical conditions you have.  Possibility of pregnancy, if this applies. BEFORE THE PROCEDURE  No special preparation is needed. Eat and drink normally.  PROCEDURE   In order to produce an image of your heart, gel will be applied to your chest and a wand-like tool (transducer) will be moved over your chest. The gel will help transmit the sound waves from the transducer. The sound waves will harmlessly bounce off your heart to allow the heart images to be captured in real-time motion. These images will then be recorded.  You may need an IV to receive a medicine that improves the quality of the pictures. AFTER THE PROCEDURE You may return to your normal schedule including diet, activities, and medicines, unless your health care provider tells you otherwise.   This information is not intended to replace advice given to you by your health care provider. Make sure you discuss any questions you have with your health care provider.   Document Released: 10/24/2000  Document Revised: 11/17/2014 Document Reviewed: 07/04/2013 Elsevier Interactive Patient Education 2016 [employer] Inc.   Holter Monitoring A Holter monitor is a small device that is used to detect abnormal heart rhythms. It clips to your clothing and is connected by wires to flat, sticky disks (electrodes) that attach to your chest. It is worn continuously for 24-48 hours. HOME CARE INSTRUCTIONS  Wear your Holter monitor at all times, even while exercising and sleeping, for as long as directed by your health care provider.  Make sure that the Holter monitor is safely clipped to your clothing or close to your body as recommended by your health care provider.  Do not get the monitor or wires wet.  Do not put body lotion or moisturizer on your chest.  Keep your skin clean.  Keep a diary of your daily activities, such as walking and doing chores. If you feel that your heartbeat is abnormal or that your heart is fluttering or skipping a beat:  Record what you are doing when it happens.  Record what time of day the symptoms occur.  Return your Holter monitor as directed by your health care provider.  Keep all follow-up visits as directed by your health care provider. This is important. SEEK IMMEDIATE MEDICAL CARE IF:  You feel lightheaded or you faint.  You have trouble breathing.  You feel pain in your chest, upper arm, or jaw.  You feel sick to your stomach and your skin is pale, cool, or damp.  You heartbeat feels unusual or abnormal.   This information  is not intended to replace advice given to you by your health care provider. Make sure you discuss any questions you have with your health care provider.   Document Released: 07/25/2004 Document Revised: 11/17/2014 Document Reviewed: 06/05/2014 Elsevier Interactive Patient Education 2016 Elsevier Inc.   Cardiac Nuclear Scanning A cardiac nuclear scan is used to check your heart for problems, such as the following:  A  portion of the heart is not getting enough blood.  Part of the heart muscle has died, which happens with a heart attack.  The heart wall is not working normally.  In this test, a radioactive dye (tracer) is injected into your bloodstream. After the tracer has traveled to your heart, a scanning device is used to measure how much of the tracer is absorbed by or distributed to various areas of your heart. LET [facility] CARE PROVIDER KNOW ABOUT:  Any allergies you have.  All medicines you are taking, including vitamins, herbs, eye drops, creams, and over-the-counter medicines.  Previous problems you or members of your family have had with the use of anesthetics.  Any blood disorders you have.  Previous surgeries you have had.  Medical conditions you have.  RISKS AND COMPLICATIONS Generally, this is a safe procedure. However, as with any procedure, problems can occur. Possible problems include:   Serious chest pain.  Rapid heartbeat.  Sensation of warmth in your chest. This usually passes quickly. BEFORE THE PROCEDURE Ask your health care provider about changing or stopping your regular medicines. PROCEDURE This procedure is usually done at a hospital and takes 2-4 hours.  An IV tube is inserted into one of your veins.  Your health care provider will inject a small amount of radioactive tracer through the tube.  You will then wait for 20-40 minutes while the tracer travels through your bloodstream.  You will lie down on an exam table so images of your heart can be taken. Images will be taken for about 15-20 minutes.  You will exercise on a treadmill or stationary bike. While you exercise, your heart activity will be monitored with an electrocardiogram (ECG), and your blood pressure will be checked.  If you are unable to exercise, you may be given a medicine to make your heart beat faster.  When blood flow to your heart has peaked, tracer will again be injected through the  IV tube.  After 20-40 minutes, you will get back on the exam table and have more images taken of your heart.  When the procedure is over, your IV tube will be removed. AFTER THE PROCEDURE  You will likely be able to leave shortly after the test. Unless your health care provider tells you otherwise, you may return to your normal schedule, including diet, activities, and medicines.  Make sure you find out how and when you will get your test results.   This information is not intended to replace advice given to you by your health care provider. Make sure you discuss any questions you have with your health care provider.   Document Released: 11/21/2004 Document Revised: 11/01/2013 Document Reviewed: 10/05/2013 Elsevier Interactive Patient Education [employer]

## 2016-02-12 ENCOUNTER — Encounter
Admission: RE | Admit: 2016-02-12 | Discharge: 2016-02-12 | Disposition: A | Discharge status: Home / Self Care | Payer: Self-pay | Source: Ambulatory Visit | Attending: Cardiology | Admitting: Cardiology

## 2016-02-12 DIAGNOSIS — R079 Chest pain, unspecified: Secondary | ICD-10-CM | POA: Insufficient documentation

## 2016-02-12 DIAGNOSIS — R002 Palpitations: Secondary | ICD-10-CM | POA: Insufficient documentation

## 2016-02-12 DIAGNOSIS — I493 Ventricular premature depolarization: Secondary | ICD-10-CM | POA: Insufficient documentation

## 2016-02-12 DIAGNOSIS — R0602 Shortness of breath: Secondary | ICD-10-CM | POA: Insufficient documentation

## 2016-02-12 DIAGNOSIS — E669 Obesity, unspecified: Secondary | ICD-10-CM | POA: Insufficient documentation

## 2016-02-12 LAB — NM MYOCAR MULTI W/SPECT W/WALL MOTION / EF
CHL CUP NUCLEAR SDS: 4
CHL CUP NUCLEAR SRS: 1
CSEPHR: 96 %
Estimated workload: 2.3 METS
Exercise duration (min): 3 min
Exercise duration (sec): 30 s
LV sys vol: 19 mL
LVDIAVOL: 66 mL (ref 46–106)
Peak HR: 151 {beats}/min
SSS: 4
TID: 0.88

## 2016-02-12 MED ORDER — TECHNETIUM TC 99M SESTAMIBI - CARDIOLITE
13.0000 | Freq: Once | INTRAVENOUS | Status: AC | PRN
  Administered 2016-02-12: 08:00:00 14.272 via INTRAVENOUS

## 2016-02-12 MED ORDER — TECHNETIUM TC 99M SESTAMIBI - CARDIOLITE
31.1990 | Freq: Once | INTRAVENOUS | Status: AC | PRN
  Administered 2016-02-12: 09:00:00 31.199 via INTRAVENOUS

## 2016-02-29 ENCOUNTER — Other Ambulatory Visit: Payer: Self-pay

## 2016-02-29 ENCOUNTER — Ambulatory Visit (INDEPENDENT_AMBULATORY_CARE_PROVIDER_SITE_OTHER): Payer: Self-pay

## 2016-02-29 DIAGNOSIS — E669 Obesity, unspecified: Secondary | ICD-10-CM

## 2016-02-29 DIAGNOSIS — R0602 Shortness of breath: Secondary | ICD-10-CM

## 2016-02-29 DIAGNOSIS — I493 Ventricular premature depolarization: Secondary | ICD-10-CM

## 2016-02-29 DIAGNOSIS — R002 Palpitations: Secondary | ICD-10-CM

## 2016-02-29 DIAGNOSIS — R079 Chest pain, unspecified: Secondary | ICD-10-CM

## 2016-03-06 ENCOUNTER — Ambulatory Visit: Payer: Self-pay | Attending: Cardiology

## 2016-03-06 DIAGNOSIS — R0602 Shortness of breath: Secondary | ICD-10-CM | POA: Insufficient documentation

## 2016-03-06 DIAGNOSIS — I493 Ventricular premature depolarization: Secondary | ICD-10-CM | POA: Insufficient documentation

## 2016-03-06 DIAGNOSIS — E669 Obesity, unspecified: Secondary | ICD-10-CM | POA: Insufficient documentation

## 2016-03-06 DIAGNOSIS — R079 Chest pain, unspecified: Secondary | ICD-10-CM | POA: Insufficient documentation

## 2016-03-06 DIAGNOSIS — R002 Palpitations: Secondary | ICD-10-CM | POA: Insufficient documentation

## 2016-03-13 ENCOUNTER — Ambulatory Visit (INDEPENDENT_AMBULATORY_CARE_PROVIDER_SITE_OTHER): Payer: Self-pay | Admitting: Cardiology

## 2016-03-13 ENCOUNTER — Encounter: Payer: Self-pay | Admitting: Cardiology

## 2016-03-13 VITALS — BP 100/80 | HR 74 | Ht 63.0 in | Wt 193.5 lb

## 2016-03-13 DIAGNOSIS — E669 Obesity, unspecified: Secondary | ICD-10-CM

## 2016-03-13 DIAGNOSIS — R000 Tachycardia, unspecified: Secondary | ICD-10-CM

## 2016-03-13 DIAGNOSIS — R079 Chest pain, unspecified: Secondary | ICD-10-CM

## 2016-03-13 DIAGNOSIS — R002 Palpitations: Secondary | ICD-10-CM

## 2016-03-13 DIAGNOSIS — R0602 Shortness of breath: Secondary | ICD-10-CM

## 2016-03-13 NOTE — Progress Notes (Addendum)
Cardiology Office Note   Date: 03/13/2016 03/14/2016   ID:  Saffron C Stripling, DOB 08-09-1951, MRN [id number]  Referring Doctor:  Emogene Morgan, MD   Cardiologist:   Almond Lint, MD   Reason for consultation:  Chief Complaint  Patient presents with  . other    F/u NM and echo. Pt c/o rapid heart beat. Pt does not take propranolol.  Meds reviewed verbally .      History of Present Illness: Jillian Thomas is a 65 y.o. female who presents for follow up after tests.  Patient continues to have some palpitations. She reports not taking propranolol. Denies P. SOB similar to presentation, no worsening.   Otherwise, patient denies headache, fever, cough, colds, but on pain, orthopnea, PND, edema. No bleeding issues.   ROS:  Please see the history of present illness. Aside from mentioned under HPI, all other systems are reviewed and negative.     History reviewed. No pertinent past medical history.  Past Surgical History  Procedure Laterality Date  . Cholecystectomy    . Tubal ligation       reports that she has never smoked. She does not have any smokeless tobacco history on file. She reports that she does not drink alcohol or use illicit drugs.   family history is not on file.  Brother's daughter died suddenly at age 28. 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 Cousin died of questionable MI at age 28.  Current Outpatient Prescriptions  Medication Sig Dispense Refill  . nitroGLYCERIN (NITROSTAT) 0.4 MG SL tablet Place 1 tablet (0.4 mg total) under the tongue every 5 (five) minutes as needed for chest pain. 25 tablet 6  . propranolol (INDERAL) 10 MG tablet Take 1 tablet (10 mg total) by mouth 2 (two) times daily. (Patient not taking: Reported on 02/05/2016) 28 tablet 0   No current facility-administered medications for this visit.  Patient is not taking any medications currently  Allergies: Review of patient's  allergies indicates no known allergies.    PHYSICAL EXAM: VS:  BP 100/80 mmHg  Pulse 74  Ht  (1.6 m)  Wt 193 lb 8 oz (87.771 kg)  BMI 34.29 kg/m2 , Body mass index is 34.29 kg/(m^2). Wt Readings from Last 3 Encounters:  03/13/16 193 lb 8 oz (87.771 kg)  02/05/16 197 lb 1.9 oz (89.413 kg)  11/20/15 174 lb (78.926 kg)    GENERAL:  well developed, well nourished,  obese, not in acute distress HEENT: normocephalic, pink conjunctivae, anicteric sclerae, no xanthelasma, normal dentition, oropharynx clear NECK:  no neck vein engorgement, JVP normal, no hepatojugular reflux, carotid upstroke brisk and symmetric, no bruit, no thyromegaly, no lymphadenopathy LUNGS:  good respiratory effort, clear to auscultation bilaterally CV:  PMI not displaced, no thrills, no lifts, S1 and S2 within normal limits, no palpable S3 or S4, no murmurs, no rubs, no gallops ABD:  Soft, nontender, nondistended, normoactive bowel sounds, no abdominal aortic bruit, no hepatomegaly, no splenomegaly MS: nontender back, no kyphosis, no scoliosis, no joint deformities EXT:  2+ DP/PT pulses, no edema, no varicosities, no cyanosis, no clubbing SKIN: warm, nondiaphoretic, normal turgor, no ulcers NEUROPSYCH: alert, oriented to person, place, and time, sensory/motor grossly intact, normal mood, appropriate affect  Recent Labs: 11/20/2015: ALT 15 01/15/2016: BUN 14; Creatinine, Ser 0.74; Hemoglobin 13.8; Magnesium 2.0; Platelets 211; Potassium 3.7; Sodium 140   Lipid Panel No results found for: CHOL, TRIG, HDL,  CHOLHDL, VLDL, LDLCALC, LDLDIRECT  Lipid panel 01/29/2016: TC 214 TG 169 HDL 46 LDL 134  Other studies Reviewed:  EKG:   The ekg from 01/28/2016 was personally reviewed by me and it revealed sinus rhythm, 70 BPM. Occasional PVCs.  EKG from 03/13/2016 showed sinus rhythm, 74 BPM.  Additional studies/ records that were reviewed personally reviewed by me today include: Echo 02/29/2016: Left ventricle: The  cavity size was normal. Wall thickness was  normal. Systolic function was normal. The estimated ejection  fraction was in the range of 50% to 55%. Wall motion was normal;  there were no regional wall motion abnormalities. Doppler  parameters are consistent with abnormal left ventricular  relaxation (grade 1 diastolic dysfunction). - Mitral valve: There was mild regurgitation.  Holter monitor for 21 2017:  Sinus rhythm, average 72 BPM  No high grade supraventricular or ventricular ectopy  Overall rhythm was sinus, average of 72 BPM. Minimum heart of 44 bpm at 4:08 AM 03/01/2016. Maximum heart rate 125 bpm at 1612 02/29/2016. Longest RR interval was normal at 1.7 seconds. 17% of the total number of beats in bradycardia.  Supraventricular ectopy: 113 isolated PACs, 8 atrial couplets. Less than 3 atrial runs, fastest was a 3 beat atrial run at 98 bpm, 3 beat run of proximal atrial tachycardia.  No high-grade ventricular ectopy: 2913 isolated PVCs. No ventricular runs.  No evidence of atrial fibrillation.   Exercise nuclear stress test 02/12/2016:  Blood pressure demonstrated a hypertensive response to exercise. Blood pressure at peak was 167/96  There was no ST segment deviation noted during stress.  Exercise myocardial perfusion imaging study with no significant Ischemia Small region of mild fixed perfusion defect in the apical region/apical thinning, consistent with attenuation artifact Normal wall motion, EF estimated at 49% No EKG changes concerning for ischemia at peak stress or in recovery. Target heart rate achieved Low risk scan  ASSESSMENT AND PLAN:  Chest pain SOB No significant ischemia on stress test. Heart function within normal limits on echocardiogram. Patient reassured  Palpitations PVCs noted on EKG 24-hour Holter monitor revealed sinus rhythm. No high-grade ventricular ectopy. Supraventricular ectopy included some PACs. Longest atrial run was 3  beats PAT at 98 BPM. Discussed these findings with patient. Encouraged patient to start propranolol back. Patient verbalized understanding and agreed with plan BMP and CBC within normal limits from 01/15/2016 ER visit TSH within normal limits from 01/29/2016  Obesity Body mass index is 34.29 kg/(m^2).Marland Kitchen. Recommend aggressive weight loss through diet and increased physical activity.   Current medicines are reviewed at length with the patient today.  The patient does not have concerns regarding medicines.  Labs/ tests ordered today include:  Orders Placed This Encounter  Procedures  . EKG 12-Lead    I had a lengthy and detailed discussion with the patient regarding diagnoses, prognosis, diagnostic options, treatment options, and side effects of medications.   I counseled the patient on importance of lifestyle modification including heart healthy diet, regular physical activity.   Disposition:   FU with undersigned if with recurrence of symptoms, prn  Signed, Almond LintAileen Reighn Kaplan, MD  03/14/2016 7:46 AM     [facility]

## 2016-03-13 NOTE — Patient Instructions (Signed)
Medication Instructions:  Your physician recommends that you continue on your current medications as directed. Please refer to the Current Medication list given to you today.   Labwork: None ordered  Testing/Procedures: None ordered  Follow-Up: Your physician recommends that you schedule a follow-up appointment as needed.   Any Other Special Instructions Will Be Listed Below (If Applicable). Make sure to follow up with your primary care provider.    If you need a refill on your cardiac medications before your next appointment, please call your pharmacy.

## 2016-03-14 ENCOUNTER — Encounter: Payer: Self-pay | Admitting: Cardiology

## 2016-03-14 DIAGNOSIS — E669 Obesity, unspecified: Secondary | ICD-10-CM | POA: Insufficient documentation

## 2016-03-14 DIAGNOSIS — R002 Palpitations: Secondary | ICD-10-CM | POA: Insufficient documentation

## 2016-03-26 ENCOUNTER — Ambulatory Visit
Admission: RE | Admit: 2016-03-26 | Discharge: 2016-03-26 | Disposition: A | Discharge status: Home / Self Care | Payer: Self-pay | Source: Ambulatory Visit | Attending: Oncology | Admitting: Oncology

## 2016-03-26 ENCOUNTER — Ambulatory Visit: Payer: No Typology Code available for payment source | Attending: Oncology

## 2016-03-26 VITALS — BP 123/82 | HR 75 | Temp 96.9°F | Ht 63.39 in | Wt 192.5 lb

## 2016-03-26 DIAGNOSIS — Z0000 Encounter for general adult medical examination without abnormal findings: Secondary | ICD-10-CM

## 2016-03-26 NOTE — Progress Notes (Signed)
Subjective:     Patient ID: Jillian Thomas, female   DOB: 07/13/1951, 65 y.o.   MRN: [id number]  HPI   Review of Systems     Objective:   Physical Exam  Pulmonary/Chest: Right breast exhibits no inverted nipple, no mass, no nipple discharge, no skin change and no tenderness. Left breast exhibits no inverted nipple, no mass, no nipple discharge, no skin change and no tenderness. Breasts are symmetrical.       Assessment:     [age] ear old patient presents for B[facility]CP clinic visit.  Patient screened, and meets B[facility]CP eligibility.  Patient does not have insurance, Medicare or Medicaid.  Handout given on Affordable Care Act.  Instructed patient on breast self-exam using teach back method.  CBE unremarkable. Patient takes care of 64 year old great granddaughter.    Plan:     Sent for bilateral screening mammogram.

## 2016-04-18 ENCOUNTER — Emergency Department
Admission: EM | Admit: 2016-04-18 | Discharge: 2016-04-18 | Disposition: A | Discharge status: Home / Self Care | Payer: No Typology Code available for payment source | Attending: Emergency Medicine | Admitting: Emergency Medicine

## 2016-04-18 ENCOUNTER — Encounter: Payer: Self-pay | Admitting: Emergency Medicine

## 2016-04-18 DIAGNOSIS — R0982 Postnasal drip: Secondary | ICD-10-CM | POA: Insufficient documentation

## 2016-04-18 DIAGNOSIS — J358 Other chronic diseases of tonsils and adenoids: Secondary | ICD-10-CM

## 2016-04-18 MED ORDER — MAGIC MOUTHWASH: 5.0000 mL | Freq: Four times a day (QID) | ORAL | Status: DC | PRN

## 2016-04-18 MED ORDER — LORATADINE 10 MG PO TABS: 10.0000 mg | ORAL_TABLET | Freq: Every day | ORAL | Status: DC

## 2016-04-18 NOTE — ED Provider Notes (Signed)
[facility] Emergency Department Provider Note  ____________________________________________  Time seen: Approximately 3:05 PM  I have reviewed the triage vital signs and the nursing notes.   HISTORY  Chief Complaint Tongue lesions     HPI Jillian Thomas is a 65 y.o. female, NAD, presents to the emergency department with the complaint of "blisters on my tongue." Reports the pain on her tongue began last week but worsened last night. She was seen by urgent care Wednesday where the provider thought she may have strep throat and started her on Amoxicillin and Prednisone. Strep testing was not completed.  States that neither of these medications have alleviated her pain. It is sharp and rated 5/10. Has been unable to visualize the lesions but says she does feel as if something is in her throat when she swallows and does not believe that she has strep throat. She admits to recent headaches, nausea, and increased fatigue. Denies fever, chills, dyspnea, dysphagia, odynophagia nor vomiting.    History reviewed. No pertinent past medical history.  Patient Active Problem List   Diagnosis Date Noted  . Palpitations 03/14/2016  . Obesity 03/14/2016    Past Surgical History  Procedure Laterality Date  . Cholecystectomy    . Tubal ligation      Current Outpatient Rx  Name  Route  Sig  Dispense  Refill  . loratadine (CLARITIN) 10 MG tablet   Oral   Take 1 tablet (10 mg total) by mouth daily.   30 tablet   0   . magic mouthwash SOLN   Oral   Take 5 mLs by mouth 4 (four) times daily as needed for mouth pain. Please mix 60mL of each ingredient totaling 180mL   180 mL   0   . nitroGLYCERIN (NITROSTAT) 0.4 MG SL tablet   Sublingual   Place 1 tablet (0.4 mg total) under the tongue every 5 (five) minutes as needed for chest pain.   25 tablet   6   . EXPIRED: propranolol (INDERAL) 10 MG tablet   Oral   Take 1 tablet (10 mg total) by mouth 2 (two) times  daily. Patient not taking: Reported on 02/05/2016   28 tablet   0     Allergies Review of patient's allergies indicates no known allergies.  No family history on file.  Social History Social History  Substance Use Topics  . Smoking status: Never Smoker   . Smokeless tobacco: None  . Alcohol Use: No     Review of Systems  Constitutional: No fever/chills. Positive for increased fatigue Eyes: No visual changes. No discharge, redness ENT: Pain about right side of throat. No odynophagia, dysphagia, nasal congestion, rhinorrhea.  Cardiovascular: No chest pain. Respiratory: No cough. No shortness of breath.  Gastrointestinal: No abdominal pain.  No nausea, vomiting.   Musculoskeletal: Negative for general myalgias.  Skin: Negative for rash. Neurological: Positive for headaches, but no focal weakness or numbness. 10-point ROS otherwise negative.  ____________________________________________   PHYSICAL EXAM:  VITAL SIGNS: ED Triage Vitals  Enc Vitals Group     BP 04/18/16 1424 134/80 mmHg     Pulse Rate 04/18/16 1424 82     Resp 04/18/16 1424 18     Temp 04/18/16 1424 98.2 F (36.8 C)     Temp Source 04/18/16 1424 Oral     SpO2 04/18/16 1424 96 %     Weight 04/18/16 1424 192 lb (87.091 kg)     Height 04/18/16 1424 5\' 3"  (1.6  m)     Head Cir --      Peak Flow --      Pain Score 04/18/16 1425 0     Pain Loc --      Pain Edu? --      Excl. in GC? --      Constitutional: Alert and oriented. Well appearing and in no acute distress. Eyes: Conjunctivae are normal.  Head: Atraumatic. ENT:      Nose: No congestion/rhinnorhea.      Mouth/Throat: Mucous membranes are moist. Mucous plug of right tonsil measuring 2-5mm with surrounding 1mm erythema that was successfully removed with sterile Q-tip, surrounding erythema that resolved with extraction. No blisters, ulcers or other lesions noted in the oropharynx nor buccal mucosa. Uvula is midline.  Neck: Supple with  FROM Hematological/Lymphatic/Immunilogical: No cervical or supraclavicular lymphadenopathy. Cardiovascular: Good peripheral circulation. Respiratory: Normal respiratory effort without tachypnea or retractions.  Neurologic:  Normal speech and language. No gross focal neurologic deficits are appreciated. Gait and posture are normal Skin:  Skin is warm, dry and intact. No rash noted. Psychiatric: Mood and affect are normal. Speech and behavior are normal. Patient exhibits appropriate insight and judgement.   ____________________________________________   LABS  None ____________________________________________  EKG  None ____________________________________________  RADIOLOGY  None ____________________________________________    PROCEDURES  Procedure(s) performed: None    Medications - No data to display   ____________________________________________   INITIAL IMPRESSION / ASSESSMENT AND PLAN / ED COURSE  Patient's diagnosis is consistent with tonsillith due to postnasal drainage. Patient will be discharged home with prescriptions for loratidine and magic mouthwash to use as directed. Advised patient to discontinue use of amoxicillin and prednisone as she exhibits no signs of streptococcal infection. Patient is to follow up with her PCP if symptoms persist past this treatment course. Patient is given ED precautions to return to the ED for any worsening or new symptoms.    ____________________________________________  FINAL CLINICAL IMPRESSION(S) / ED DIAGNOSES  Final diagnoses:  Tonsillith  Post-nasal drainage      NEW MEDICATIONS STARTED DURING THIS VISIT:  Discharge Medication List as of 04/18/2016  3:21 PM    START taking these medications   Details  loratadine (CLARITIN) 10 MG tablet Take 1 tablet (10 mg total) by mouth daily., Starting 04/18/2016, Until Discontinued, Print    magic mouthwash SOLN Take 5 mLs by mouth 4 (four) times daily as needed for  mouth pain. Please mix 60mL of each ingredient totaling , Starting 04/18/2016, Until Discontinued, Print             Hope Pigeon, PA-C 04/18/16 1538  Jene Every, MD 04/18/16 [id number]

## 2016-04-18 NOTE — Discharge Instructions (Signed)

## 2016-04-18 NOTE — ED Notes (Signed)
Pt verbalized understanding of discharge instructions. NAD at this time. 

## 2016-04-18 NOTE — ED Notes (Addendum)
Pt presents to ED with reports of blisters on her tongue that began approximately two weeks ago. Pt states was seen at Urgent Care yesterday and given amoxicillin but the blisters are not getting better. Pt denies sore throat. Pt denies fever. Pt speaking in complete sentences without difficulty in triage. No obvious lesions noted to tongue in triage. Pt denies pain other than when she swallows.

## 2016-04-22 NOTE — Progress Notes (Signed)
Letter mailed from [facility] to notify of normal mammogram results.  Patient to return in one year for annual screening.  Copy to [facility]. 

## 2016-05-29 ENCOUNTER — Emergency Department: Payer: Self-pay

## 2016-05-29 ENCOUNTER — Encounter: Payer: Self-pay | Admitting: Emergency Medicine

## 2016-05-29 DIAGNOSIS — J40 Bronchitis, not specified as acute or chronic: Secondary | ICD-10-CM | POA: Insufficient documentation

## 2016-05-29 LAB — BASIC METABOLIC PANEL
Anion gap: 7 (ref 5–15)
BUN: 14 mg/dL (ref 6–20)
CHLORIDE: 106 mmol/L (ref 101–111)
CO2: 27 mmol/L (ref 22–32)
CREATININE: 0.88 mg/dL (ref 0.44–1.00)
Calcium: 9.3 mg/dL (ref 8.9–10.3)
GFR calc Af Amer: >60 mL/min (ref >60)
GFR calc non Af Amer: >60 mL/min (ref >60)
GLUCOSE: 156 mg/dL — AB (ref 65–99)
POTASSIUM: 4.4 mmol/L (ref 3.5–5.1)
Sodium: 140 mmol/L (ref 135–145)

## 2016-05-29 LAB — CBC
HEMATOCRIT: 39.4 % (ref 35.0–47.0)
Hemoglobin: 13.9 g/dL (ref 12.0–16.0)
MCH: 30 pg (ref 26.0–34.0)
MCHC: 35.2 g/dL (ref 32.0–36.0)
MCV: 85.2 fL (ref 80.0–100.0)
PLATELETS: 208 10*3/uL (ref 150–440)
RBC: 4.63 MIL/uL (ref 3.80–5.20)
RDW: 13.2 % (ref 11.5–14.5)
WBC: 6.5 10*3/uL (ref 3.6–11.0)

## 2016-05-29 LAB — TROPONIN I: Troponin I: <0.03 ng/mL (ref <0.03)

## 2016-05-29 NOTE — ED Notes (Signed)
Patient states that she feels like she is developing bronchitis. Patient reports that she developed cough, congestion and shortness of breath last night and this is how she felt when she had bronchitis in the past.

## 2016-05-30 ENCOUNTER — Emergency Department
Admission: EM | Admit: 2016-05-30 | Discharge: 2016-05-30 | Disposition: A | Discharge status: Home / Self Care | Payer: Self-pay | Attending: Emergency Medicine | Admitting: Emergency Medicine

## 2016-05-30 DIAGNOSIS — J40 Bronchitis, not specified as acute or chronic: Secondary | ICD-10-CM

## 2016-05-30 MED ORDER — ALBUTEROL SULFATE HFA 108 (90 BASE) MCG/ACT IN AERS: 2.0000 | INHALATION_SPRAY | Freq: Four times a day (QID) | RESPIRATORY_TRACT | Status: DC | PRN

## 2016-05-30 MED ORDER — PREDNISONE 20 MG PO TABS: 60.0000 mg | ORAL_TABLET | Freq: Every day | ORAL | Status: AC

## 2016-05-30 MED ORDER — AZITHROMYCIN 500 MG PO TABS
500.0000 mg | ORAL_TABLET | Freq: Once | ORAL | Status: AC
  Administered 2016-05-30: 500 mg via ORAL
  Filled 2016-05-30: qty 1

## 2016-05-30 MED ORDER — PREDNISONE 20 MG PO TABS
60.0000 mg | ORAL_TABLET | Freq: Once | ORAL | Status: AC
  Administered 2016-05-30: 60 mg via ORAL
  Filled 2016-05-30: qty 3

## 2016-05-30 MED ORDER — AZITHROMYCIN 250 MG PO TABS: ORAL_TABLET | ORAL | Status: AC

## 2016-05-30 MED ORDER — IPRATROPIUM-ALBUTEROL 0.5-2.5 (3) MG/3ML IN SOLN
RESPIRATORY_TRACT | Status: AC
  Administered 2016-05-30: 3 mL via RESPIRATORY_TRACT
  Filled 2016-05-30: qty 3

## 2016-05-30 MED ORDER — IPRATROPIUM-ALBUTEROL 0.5-2.5 (3) MG/3ML IN SOLN
3.0000 mL | Freq: Once | RESPIRATORY_TRACT | Status: AC
  Administered 2016-05-30: 3 mL via RESPIRATORY_TRACT

## 2016-05-30 NOTE — ED Notes (Signed)
Discharge instructions reviewed with patient. Patient verbalized understanding. Patient ambulated to lobby without difficulty.   

## 2016-05-30 NOTE — ED Provider Notes (Signed)
[facility] Emergency Department Provider Note  ____________________________________________  Time seen: Approximately 210 AM  I have reviewed the triage vital signs and the nursing notes.   HISTORY  Chief Complaint Shortness of Breath and Nasal Congestion   HPI Jillian Thomas is a 65 y.o. female without any chronic medical conditions was presenting to the emergency department today with 3 days of cough, shortness of breath as well as nasal congestion. She says that it feels like when she had bronchitis previously. She denies smoking. Although she does have secondhand smoke exposure. Denies any pain. Denies any fevers.   History reviewed. No pertinent past medical history.  Patient Active Problem List   Diagnosis Date Noted  . Palpitations 03/14/2016  . Obesity 03/14/2016    Past Surgical History  Procedure Laterality Date  . Cholecystectomy    . Tubal ligation      Current Outpatient Rx  Name  Route  Sig  Dispense  Refill  . loratadine (CLARITIN) 10 MG tablet   Oral   Take 1 tablet (10 mg total) by mouth daily.   30 tablet   0   . magic mouthwash SOLN   Oral   Take 5 mLs by mouth 4 (four) times daily as needed for mouth pain. Please mix 60mL of each ingredient totaling 180mL   180 mL   0   . nitroGLYCERIN (NITROSTAT) 0.4 MG SL tablet   Sublingual   Place 1 tablet (0.4 mg total) under the tongue every 5 (five) minutes as needed for chest pain.   25 tablet   6   . EXPIRED: propranolol (INDERAL) 10 MG tablet   Oral   Take 1 tablet (10 mg total) by mouth 2 (two) times daily. Patient not taking: Reported on 02/05/2016   28 tablet   0     Allergies Review of patient's allergies indicates no known allergies.  No family history on file.  Social History Social History  Substance Use Topics  . Smoking status: Never Smoker   . Smokeless tobacco: None  . Alcohol Use: No    Review of Systems Constitutional: No  fever/chills Eyes: No visual changes. ENT: No sore throat. Cardiovascular:  No chest pain Pulmonary: As above. Gastrointestinal: No abdominal pain.  No nausea, no vomiting.  No diarrhea.  No constipation. Genitourinary: Negative for dysuria. Musculoskeletal: Negative for back pain. Skin: Negative for rash. Neurological: Negative for headaches, focal weakness or numbness.  10-point ROS otherwise negative.  ____________________________________________   PHYSICAL EXAM:  VITAL SIGNS: ED Triage Vitals  Enc Vitals Group     BP 05/29/16 2140 123/67 mmHg     Pulse Rate 05/29/16 2140 80     Resp 05/29/16 2140 20     Temp 05/29/16 2140 98.1 F (36.7 C)     Temp Source 05/29/16 2140 Oral     SpO2 05/29/16 2140 96 %     Weight 05/29/16 2140 190 lb (86.183 kg)     Height 05/29/16 2140 5\' 3"  (1.6 m)     Head Cir --      Peak Flow --      Pain Score 05/30/16 0200 0     Pain Loc --      Pain Edu? --      Excl. in GC? --     Constitutional: Alert and oriented. Well appearing and in no acute distress. Eyes: Conjunctivae are normal. PERRL. EOMI. Head: Atraumatic. Nose: No congestion/rhinnorhea. Mouth/Throat: Mucous membranes are moist.  Neck: No stridor.   Cardiovascular: Normal rate, regular rhythm. Grossly normal heart sounds.  Good peripheral circulation. Respiratory: Normal respiratory effort.  No retractions. Mild wheezing to the left upper and lower fields. Good air movement without any respiratory distress. The patient is speaking in full signs. Gastrointestinal: Soft and nontender. No distention. No abdominal bruits. No CVA tenderness. Musculoskeletal: No lower extremity tenderness nor edema.  No joint effusions. Neurologic:  Normal speech and language. No gross focal neurologic deficits are appreciated. No gait instability. Skin:  Skin is warm, dry and intact. No rash noted. Psychiatric: Mood and affect are normal. Speech and behavior are  normal.  ____________________________________________   LABS (all labs ordered are listed, but only abnormal results are displayed)  Labs Reviewed  BASIC METABOLIC PANEL - Abnormal; Notable for the following:    Glucose, Bld 156 (*)    All other components within normal limits  CBC  TROPONIN I   ____________________________________________  EKG  ED ECG REPORT I, Arelia Longest, the attending physician, personally viewed and interpreted this ECG.   Date: 05/30/2016  EKG Time: 2145  Rate: 81  Rhythm: normal sinus rhythm  Axis: Normal  Intervals:none  ST&T Change: No ST segment elevation or depression. No abnormal T-wave inversion.  ____________________________________________  RADIOLOGY  DG Chest 2 View (Final result) Result time: 05/29/16 22:15:15   Final result by Rad Results In Interface (05/29/16 22:15:15)   Narrative:   CLINICAL DATA: Cough  EXAM: CHEST 2 VIEW  COMPARISON: January 15, 2016  FINDINGS: The heart size and mediastinal contours are within normal limits. Both lungs are clear. The visualized skeletal structures are unremarkable.  IMPRESSION: No active cardiopulmonary disease.   Electronically Signed By: Gerome Sam III M.D On: 05/29/2016 22:15    ____________________________________________   PROCEDURES    Procedures   ____________________________________________   INITIAL IMPRESSION / ASSESSMENT AND PLAN / ED COURSE  Pertinent labs & imaging results that were available during my care of the patient were reviewed by me and considered in my medical decision making (see chart for details).  ----------------------------------------- 2:19 AM on 05/30/2016 -----------------------------------------  Patient feeling better with albuterol. We'll discharge with an inhaler as well as prednisone and azithromycin. Likely bronchitis with a possible early pneumonia especially with focal wheezing to the left field. Very  reassuring lab as well as radiology workup. Patient will be following up with Phineas Real or she has her primary care. ____________________________________________   FINAL CLINICAL IMPRESSION(S) / ED DIAGNOSES  Bronchitis.    NEW MEDICATIONS STARTED DURING THIS VISIT:  New Prescriptions   No medications on file     Note:  This document was prepared using Dragon voice recognition software and may include unintentional dictation errors.    Myrna Blazer, MD 05/30/16 [id number]

## 2016-06-01 ENCOUNTER — Emergency Department: Payer: Self-pay

## 2016-06-01 ENCOUNTER — Encounter: Payer: Self-pay | Admitting: Emergency Medicine

## 2016-06-01 ENCOUNTER — Emergency Department
Admission: EM | Admit: 2016-06-01 | Discharge: 2016-06-01 | Disposition: A | Discharge status: Home / Self Care | Payer: Self-pay | Attending: Emergency Medicine | Admitting: Emergency Medicine

## 2016-06-01 DIAGNOSIS — Z7952 Long term (current) use of systemic steroids: Secondary | ICD-10-CM | POA: Insufficient documentation

## 2016-06-01 DIAGNOSIS — Z792 Long term (current) use of antibiotics: Secondary | ICD-10-CM | POA: Insufficient documentation

## 2016-06-01 DIAGNOSIS — R110 Nausea: Secondary | ICD-10-CM | POA: Insufficient documentation

## 2016-06-01 LAB — CBC WITH DIFFERENTIAL/PLATELET
Basophils Absolute: 0 10*3/uL (ref 0–0.1)
Basophils Relative: 0 %
EOS PCT: 0 %
Eosinophils Absolute: 0 10*3/uL (ref 0–0.7)
HEMATOCRIT: 40.3 % (ref 35.0–47.0)
Hemoglobin: 14.1 g/dL (ref 12.0–16.0)
LYMPHS ABS: 1.2 10*3/uL (ref 1.0–3.6)
LYMPHS PCT: 16 %
MCH: 29.5 pg (ref 26.0–34.0)
MCHC: 34.9 g/dL (ref 32.0–36.0)
MCV: 84.6 fL (ref 80.0–100.0)
MONO ABS: 0.1 10*3/uL — AB (ref 0.2–0.9)
MONOS PCT: 2 %
NEUTROS ABS: 6.5 10*3/uL (ref 1.4–6.5)
Neutrophils Relative %: 82 %
PLATELETS: 215 10*3/uL (ref 150–440)
RBC: 4.76 MIL/uL (ref 3.80–5.20)
RDW: 13.5 % (ref 11.5–14.5)
WBC: 7.9 10*3/uL (ref 3.6–11.0)

## 2016-06-01 LAB — BASIC METABOLIC PANEL
Anion gap: 8 (ref 5–15)
BUN: 11 mg/dL (ref 6–20)
CALCIUM: 9.2 mg/dL (ref 8.9–10.3)
CO2: 23 mmol/L (ref 22–32)
Chloride: 108 mmol/L (ref 101–111)
Creatinine, Ser: 0.87 mg/dL (ref 0.44–1.00)
GFR calc Af Amer: >60 mL/min (ref >60)
GLUCOSE: 175 mg/dL — AB (ref 65–99)
Potassium: 3.8 mmol/L (ref 3.5–5.1)
Sodium: 139 mmol/L (ref 135–145)

## 2016-06-01 MED ORDER — ONDANSETRON HCL 4 MG PO TABS: 4.0000 mg | ORAL_TABLET | Freq: Three times a day (TID) | ORAL | 0 refills | Status: DC | PRN

## 2016-06-01 NOTE — ED Notes (Signed)

## 2016-06-01 NOTE — ED Provider Notes (Signed)
[facility] Emergency Department Provider Note    ____________________________________________   I have reviewed the triage vital signs and the nursing notes.   HISTORY  Chief Complaint Other (bronchitis)   History limited by: Not Limited   HPI Jillian Thomas is a 65 y.o. female who presents to the emergency department today because of continued symptoms concerning for bronchitis. The patient feels like she has been continuing to wheeze and having shortness of breath. In addition the patient now states she is having some nausea. She has been taking the azithromycin and steroids as well as in the inhaler prescribed to her. She denies any fevers.   History reviewed. No pertinent past medical history.  Patient Active Problem List   Diagnosis Date Noted  . Palpitations 03/14/2016  . Obesity 03/14/2016    Past Surgical History:  Procedure Laterality Date  . CHOLECYSTECTOMY    . TUBAL LIGATION     Allergies Review of patient's allergies indicates no known allergies.  History reviewed. No pertinent family history.  Social History Social History  Substance Use Topics  . Smoking status: Never Smoker  . Smokeless tobacco: Not on file  . Alcohol use No    Review of Systems  Constitutional: Negative for fever. Cardiovascular: Negative for chest pain. Respiratory: Positive for shortness of breath and wheeze Gastrointestinal: Negative for abdominal pain, vomiting and diarrhea. Positive for nausea Neurological: Negative for headaches, focal weakness or numbness.   10-point ROS otherwise negative.  ____________________________________________   PHYSICAL EXAM:  VITAL SIGNS: ED Triage Vitals  Enc Vitals Group     BP 06/01/16 1557 123/83     Pulse Rate 06/01/16 1557 95     Resp 06/01/16 1555 16     Temp 06/01/16 1555 98.2 F (36.8 C)     Temp Source 06/01/16 1555 Oral     SpO2 06/01/16 1557 96 %     Weight 06/01/16 1555 190 lb (86.2 kg)      Height 06/01/16 1555 5\' 3"  (1.6 m)   Constitutional: Alert and oriented. Well appearing and in no distress. Eyes: Conjunctivae are normal. PERRL. Normal extraocular movements. ENT   Head: Normocephalic and atraumatic.   Nose: No congestion/rhinnorhea.   Mouth/Throat: Mucous membranes are moist.   Neck: No stridor. Hematological/Lymphatic/Immunilogical: No cervical lymphadenopathy. Cardiovascular: Normal rate, regular rhythm.  No murmurs, rubs, or gallops. Respiratory: Normal respiratory effort without tachypnea nor retractions. Breath sounds are clear and equal bilaterally. No wheezes/rales/rhonchi. Gastrointestinal: Soft and nontender. No distention.  Genitourinary: Deferred Musculoskeletal: Normal range of motion in all extremities. No joint effusions.  No lower extremity tenderness nor edema. Neurologic:  Normal speech and language. No gross focal neurologic deficits are appreciated.  Skin:  Skin is warm, dry and intact. No rash noted. Psychiatric: Mood and affect are normal. Speech and behavior are normal. Patient exhibits appropriate insight and judgment.  ____________________________________________    LABS (pertinent positives/negatives)  Labs Reviewed  CBC WITH DIFFERENTIAL/PLATELET - Abnormal; Notable for the following:       Result Value   Monocytes Absolute 0.1 (*)    All other components within normal limits  BASIC METABOLIC PANEL - Abnormal; Notable for the following:    Glucose, Bld 175 (*)    All other components within normal limits    ____________________________________________   EKG  I, Phineas Semen, attending physician, personally viewed and interpreted this EKG  EKG Time: 1601 Rate: 84 Rhythm: normal sinus rhythm Axis: left axis deviation Intervals: qtc 427 QRS: narrow  ST changes: no st elevation Impression: normal ekg  ____________________________________________    RADIOLOGY  CXR IMPRESSION: Negative  chest.  ____________________________________________   PROCEDURES  Procedures  ____________________________________________   INITIAL IMPRESSION / ASSESSMENT AND PLAN / ED COURSE  Pertinent labs & imaging results that were available during my care of the patient were reviewed by me and considered in my medical decision making (see chart for details).  Patient presented to the emergency department today because of concerns for continued symptoms of bronchitis. On exam patient in no respiratory distress. No wheezing. Repeat chest x-ray today without any concerning findings. His complaining of some nausea which might be secondary to medications. She is also requesting something for sleep. Discussed with patient that she can try melatonin and Benadryl for sleep. Will give Zofran. ____________________________________________   FINAL CLINICAL IMPRESSION(S) / ED DIAGNOSES  Final diagnoses:  Nausea     Note: This dictation was prepared with Dragon dictation. Any transcriptional errors that result from this process are unintentional    Phineas Semen, MD 06/01/16 1715

## 2016-06-01 NOTE — ED Triage Notes (Signed)
Pt seen here Thursday for and dx with bronchitis. DC with abx and steroids but getting worse and feels like harder to breath. No respiratory distress noted in triage.

## 2016-06-01 NOTE — Discharge Instructions (Signed)
As we discussed you can try to use benadryl and melatonin to help you sleep at night. Please seek medical attention for any high fevers, chest pain, shortness of breath, change in behavior, persistent vomiting, bloody stool or any other new or concerning symptoms.

## 2016-06-01 NOTE — ED Notes (Signed)
Pt given info sheet for medication management clinic in [place].

## 2016-06-16 ENCOUNTER — Encounter: Payer: Self-pay | Admitting: Emergency Medicine

## 2016-06-16 ENCOUNTER — Emergency Department: Payer: Self-pay

## 2016-06-16 ENCOUNTER — Emergency Department
Admission: EM | Admit: 2016-06-16 | Discharge: 2016-06-17 | Disposition: A | Discharge status: Home / Self Care | Payer: Self-pay | Attending: Emergency Medicine | Admitting: Emergency Medicine

## 2016-06-16 DIAGNOSIS — R091 Pleurisy: Secondary | ICD-10-CM | POA: Insufficient documentation

## 2016-06-16 DIAGNOSIS — Z79899 Other long term (current) drug therapy: Secondary | ICD-10-CM | POA: Insufficient documentation

## 2016-06-16 DIAGNOSIS — Z7952 Long term (current) use of systemic steroids: Secondary | ICD-10-CM | POA: Insufficient documentation

## 2016-06-16 DIAGNOSIS — M546 Pain in thoracic spine: Secondary | ICD-10-CM

## 2016-06-16 LAB — CBC WITH DIFFERENTIAL/PLATELET
Basophils Absolute: 0 10*3/uL (ref 0–0.1)
Basophils Relative: 1 %
Eosinophils Absolute: 0.1 10*3/uL (ref 0–0.7)
Eosinophils Relative: 2 %
HEMATOCRIT: 40.4 % (ref 35.0–47.0)
HEMOGLOBIN: 14.7 g/dL (ref 12.0–16.0)
Lymphs Abs: 2.4 10*3/uL (ref 1.0–3.6)
MCH: 30.9 pg (ref 26.0–34.0)
MCHC: 36.3 g/dL — AB (ref 32.0–36.0)
MCV: 85.3 fL (ref 80.0–100.0)
Monocytes Absolute: 0.4 10*3/uL (ref 0.2–0.9)
Monocytes Relative: 5 %
NEUTROS ABS: 4.5 10*3/uL (ref 1.4–6.5)
Platelets: 232 10*3/uL (ref 150–440)
RBC: 4.74 MIL/uL (ref 3.80–5.20)
RDW: 13.5 % (ref 11.5–14.5)
WBC: 7.5 10*3/uL (ref 3.6–11.0)

## 2016-06-16 LAB — COMPREHENSIVE METABOLIC PANEL
ALK PHOS: 63 U/L (ref 38–126)
ALT: 16 U/L (ref 14–54)
ANION GAP: 5 (ref 5–15)
AST: 19 U/L (ref 15–41)
Albumin: 4 g/dL (ref 3.5–5.0)
BILIRUBIN TOTAL: 0.6 mg/dL (ref 0.3–1.2)
BUN: 11 mg/dL (ref 6–20)
CALCIUM: 9.1 mg/dL (ref 8.9–10.3)
CO2: 27 mmol/L (ref 22–32)
CREATININE: 0.81 mg/dL (ref 0.44–1.00)
Chloride: 106 mmol/L (ref 101–111)
GFR calc non Af Amer: >60 mL/min (ref >60)
GLUCOSE: 181 mg/dL — AB (ref 65–99)
Potassium: 4.2 mmol/L (ref 3.5–5.1)
SODIUM: 138 mmol/L (ref 135–145)
TOTAL PROTEIN: 6.9 g/dL (ref 6.5–8.1)

## 2016-06-16 LAB — LACTIC ACID, PLASMA: LACTIC ACID, VENOUS: 1.2 mmol/L (ref 0.5–1.9)

## 2016-06-16 LAB — TROPONIN I: Troponin I: <0.03 ng/mL (ref <0.03)

## 2016-06-16 NOTE — ED Triage Notes (Signed)
Pt presents to ED with upper back pain "states lungs pain," pain worse with deep breathing. Pt also c/o generalize weakness and nausea. Pt reports she was here about a week ago and diagnosed with bronchitis.

## 2016-06-17 LAB — URINALYSIS COMPLETE WITH MICROSCOPIC (ARMC ONLY)
BILIRUBIN URINE: NEGATIVE
Bacteria, UA: NONE SEEN
GLUCOSE, UA: NEGATIVE mg/dL
KETONES UR: NEGATIVE mg/dL
Leukocytes, UA: NEGATIVE
Nitrite: NEGATIVE
Protein, ur: NEGATIVE mg/dL
Specific Gravity, Urine: 1.017 (ref 1.005–1.030)
pH: 5 (ref 5.0–8.0)

## 2016-06-17 LAB — FIBRIN DERIVATIVES D-DIMER (ARMC ONLY): FIBRIN DERIVATIVES D-DIMER (ARMC): 452 (ref 0–499)

## 2016-06-17 MED ORDER — HYDROCOD POLST-CPM POLST ER 10-8 MG/5ML PO SUER: 5.0000 mL | Freq: Two times a day (BID) | ORAL | 0 refills | Status: DC

## 2016-06-17 MED ORDER — HYDROCOD POLST-CPM POLST ER 10-8 MG/5ML PO SUER
5.0000 mL | Freq: Once | ORAL | Status: AC
  Administered 2016-06-17: 5 mL via ORAL
  Filled 2016-06-17: qty 5

## 2016-06-17 NOTE — ED Provider Notes (Signed)
[facility] Emergency Department Provider Note   ____________________________________________   First MD Initiated Contact with Patient 06/17/16 0016     (approximate)  I have reviewed the triage vital signs and the nursing notes.   HISTORY  Chief Complaint Shortness of Breath and Back Pain    HPI Jillian Thomas is a 65 y.o. female who presents to the ED from home with a chief complaint of right upper back pain. Patient describes "lung pain" worse with deep breathing. Recently finished a Z-Pak and steroids for bronchitis. Patient also complains of generalized weakness and nausea. Denies associated fever, chills, chest pain, shortness of breath, abdominal pain, vomiting, dysuria, diarrhea. Denies recent travel, trauma or hormone use. Nothing makes the pain better. Deep breathing makes the pain worse.   Past medical history Bronchitis  Patient Active Problem List   Diagnosis Date Noted  . Palpitations 03/14/2016  . Obesity 03/14/2016    Past Surgical History:  Procedure Laterality Date  . CHOLECYSTECTOMY    . TUBAL LIGATION      Prior to Admission medications   Medication Sig Start Date End Date Taking? Authorizing Provider  albuterol (PROVENTIL HFA;VENTOLIN HFA) 108 (90 Base) MCG/ACT inhaler Inhale 2 puffs into the lungs every 6 (six) hours as needed for wheezing or shortness of breath. 05/30/16   Myrna Blazer, MD  loratadine (CLARITIN) 10 MG tablet Take 1 tablet (10 mg total) by mouth daily. 04/18/16   Jami L Hagler, PA-C  magic mouthwash SOLN Take 5 mLs by mouth 4 (four) times daily as needed for mouth pain. Please mix 60mL of each ingredient totaling 04/18/16   Jami L Hagler, PA-C  nitroGLYCERIN (NITROSTAT) 0.4 MG SL tablet Place 1 tablet (0.4 mg total) under the tongue every 5 (five) minutes as needed for chest pain. 02/05/16   Almond Lint, MD  ondansetron (ZOFRAN) 4 MG tablet Take 1 tablet (4 mg total) by mouth every 8 (eight)  hours as needed. 06/01/16   Phineas Semen, MD  predniSONE (DELTASONE) 20 MG tablet Take 3 tablets (60 mg total) by mouth daily. 05/31/16 05/31/17  Myrna Blazer, MD  propranolol (INDERAL) 10 MG tablet Take 1 tablet (10 mg total) by mouth 2 (two) times daily. Patient not taking: Reported on 02/05/2016 01/15/16 01/29/16  Jennye Moccasin, MD    Allergies Review of patient's allergies indicates no known allergies.  History reviewed. No pertinent family history.  Social History Social History  Substance Use Topics  . Smoking status: Never Smoker  . Smokeless tobacco: Never Used  . Alcohol use No    Review of Systems  Constitutional: No fever/chills. Eyes: No visual changes. ENT: No sore throat. Cardiovascular: Denies chest pain. Respiratory: Denies shortness of breath. Gastrointestinal: No abdominal pain.  No nausea, no vomiting.  No diarrhea.  No constipation. Genitourinary: Negative for dysuria. Musculoskeletal: Positive for back pain. Skin: Negative for rash. Neurological: Negative for headaches, focal weakness or numbness.  10-point ROS otherwise negative.  ____________________________________________   PHYSICAL EXAM:  VITAL SIGNS: ED Triage Vitals  Enc Vitals Group     BP 06/16/16 1946 133/81     Pulse Rate 06/16/16 1946 92     Resp 06/16/16 1946 18     Temp 06/16/16 1946 98.2 F (36.8 C)     Temp Source 06/16/16 1946 Oral     SpO2 06/16/16 1946 92 %     Weight 06/16/16 1946 190 lb (86.2 kg)     Height 06/16/16 1946   (1.6 m)     Head Circumference --      Peak Flow --      Pain Score 06/16/16 1955 7     Pain Loc --      Pain Edu? --      Excl. in GC? --     Constitutional: Alert and oriented. Well appearing and in no acute distress. Eyes: Conjunctivae are normal. PERRL. EOMI. Head: Atraumatic. Nose: No congestion/rhinnorhea. Mouth/Throat: Mucous membranes are moist.  Oropharynx non-erythematous. Neck: No stridor.   Cardiovascular: Normal  rate, regular rhythm. Grossly normal heart sounds.  Good peripheral circulation. Respiratory: Normal respiratory effort.  No retractions. Lungs CTAB. No rib tenderness to palpation. Gastrointestinal: Soft and nontender. No distention. No abdominal bruits. No CVA tenderness. Musculoskeletal: No midline spinal tenderness to palpation. No paraspinal thoracic tenderness to palpation. No lower extremity tenderness nor edema.  No joint effusions. Neurologic:  Normal speech and language. No gross focal neurologic deficits are appreciated. No gait instability. Skin:  Skin is warm, dry and intact. No rash noted. Psychiatric: Mood and affect are normal. Speech and behavior are normal.  ____________________________________________   LABS (all labs ordered are listed, but only abnormal results are displayed)  Labs Reviewed  CBC WITH DIFFERENTIAL/PLATELET - Abnormal; Notable for the following:       Result Value   MCHC 36.3 (*)    All other components within normal limits  COMPREHENSIVE METABOLIC PANEL - Abnormal; Notable for the following:    Glucose, Bld 181 (*)    All other components within normal limits  URINALYSIS COMPLETEWITH MICROSCOPIC ([facility] ONLY) - Abnormal; Notable for the following:    Color, Urine YELLOW (*)    APPearance CLEAR (*)    Hgb urine dipstick 1+ (*)    Squamous Epithelial / LPF 0-5 (*)    All other components within normal limits  TROPONIN I  LACTIC ACID, PLASMA  FIBRIN DERIVATIVES D-DIMER ([facility] ONLY)   ____________________________________________  EKG  ED ECG REPORT I, SUNG,JADE J, the attending physician, personally viewed and interpreted this ECG.   Date: 06/17/2016  EKG Time: 2000  Rate: 85  Rhythm: normal EKG, normal sinus rhythm  Axis: Within normal limits  Intervals:none  ST&T Change: Nonspecific  ____________________________________________  RADIOLOGY  Chest 2 view (view by me, interpreted per Dr. Ruffin Frederick):  No acute cardiopulmonary  disease ____________________________________________   PROCEDURES  Procedure(s) performed: None  Procedures  Critical Care performed: No  ____________________________________________   INITIAL IMPRESSION / ASSESSMENT AND PLAN / ED COURSE  Pertinent labs & imaging results that were available during my care of the patient were reviewed by me and considered in my medical decision making (see chart for details).  65 year old female who presents with right upper thoracic pain which she describes as "lung pain". Recently finished Z-Pak and steroid for her bronchitis. Initial EKG, troponin and chest x-ray are unremarkable. Patient is currently resting in no acute distress. Will check a d-dimer and proceed with CT chest if positive.  Clinical Course  Comment By Time  Delay secondary to other critical patients. Updated patient of negative d-dimer results. She is sleeping in no acute distress. Room air saturations 97%. Strict return precautions given. Patient verbalizes understanding and agrees with plan of care. Irean Hong, MD 08/08 0255     ____________________________________________   FINAL CLINICAL IMPRESSION(S) / ED DIAGNOSES  Final diagnoses:  Pleurisy  Right-sided thoracic back pain      NEW MEDICATIONS STARTED DURING THIS VISIT:  New  Prescriptions   No medications on file     Note:  This document was prepared using Dragon voice recognition software and may include unintentional dictation errors.    Irean HongJade J Sung, MD 06/17/16 [phone]

## 2016-06-17 NOTE — Discharge Instructions (Signed)
1. You may take Tylenol and/or Motrin as needed for discomfort. 2. Return to the ER for worsening symptoms, persistent vomiting, difficulty breathing or other concerns.

## 2016-10-21 ENCOUNTER — Encounter: Payer: Self-pay | Admitting: Emergency Medicine

## 2016-10-21 DIAGNOSIS — Z79899 Other long term (current) drug therapy: Secondary | ICD-10-CM | POA: Insufficient documentation

## 2016-10-21 DIAGNOSIS — R1031 Right lower quadrant pain: Secondary | ICD-10-CM | POA: Insufficient documentation

## 2016-10-21 LAB — CBC
HEMATOCRIT: 41.1 % (ref 35.0–47.0)
Hemoglobin: 14.4 g/dL (ref 12.0–16.0)
MCH: 30 pg (ref 26.0–34.0)
MCHC: 35 g/dL (ref 32.0–36.0)
MCV: 85.6 fL (ref 80.0–100.0)
Platelets: 249 10*3/uL (ref 150–440)
RBC: 4.81 MIL/uL (ref 3.80–5.20)
RDW: 13.3 % (ref 11.5–14.5)
WBC: 8.8 10*3/uL (ref 3.6–11.0)

## 2016-10-21 LAB — URINALYSIS, COMPLETE (UACMP) WITH MICROSCOPIC
BACTERIA UA: NONE SEEN
BILIRUBIN URINE: NEGATIVE
Glucose, UA: NEGATIVE mg/dL
Ketones, ur: NEGATIVE mg/dL
Nitrite: NEGATIVE
PROTEIN: NEGATIVE mg/dL
Specific Gravity, Urine: 1.021 (ref 1.005–1.030)
pH: 5 (ref 5.0–8.0)

## 2016-10-21 LAB — COMPREHENSIVE METABOLIC PANEL
ALBUMIN: 4.5 g/dL (ref 3.5–5.0)
ALT: 19 U/L (ref 14–54)
AST: 27 U/L (ref 15–41)
Alkaline Phosphatase: 70 U/L (ref 38–126)
Anion gap: 7 (ref 5–15)
BUN: 12 mg/dL (ref 6–20)
CHLORIDE: 105 mmol/L (ref 101–111)
CO2: 26 mmol/L (ref 22–32)
Calcium: 9.3 mg/dL (ref 8.9–10.3)
Creatinine, Ser: 0.86 mg/dL (ref 0.44–1.00)
GFR calc Af Amer: >60 mL/min (ref >60)
Glucose, Bld: 147 mg/dL — ABNORMAL HIGH (ref 65–99)
POTASSIUM: 3.4 mmol/L — AB (ref 3.5–5.1)
SODIUM: 138 mmol/L (ref 135–145)
Total Bilirubin: 0.7 mg/dL (ref 0.3–1.2)
Total Protein: 7.3 g/dL (ref 6.5–8.1)

## 2016-10-21 LAB — LIPASE, BLOOD: LIPASE: 29 U/L (ref 11–51)

## 2016-10-21 NOTE — ED Triage Notes (Signed)
Pt ambulatory to triage with steady gait with multiple medical complaints, pt c/o lower back pain radiating to RLQ accompanied by nausea, dysuria, increased urinary frequency. Pt states "I think my teeth are infected and that is what's causing my nausea." Poor dental hygiene noticed. Pt denies chest pain, hematuria.

## 2016-10-22 ENCOUNTER — Emergency Department: Payer: Self-pay

## 2016-10-22 ENCOUNTER — Emergency Department
Admission: EM | Admit: 2016-10-22 | Discharge: 2016-10-22 | Disposition: A | Discharge status: Home / Self Care | Payer: Self-pay | Attending: Emergency Medicine | Admitting: Emergency Medicine

## 2016-10-22 DIAGNOSIS — R1031 Right lower quadrant pain: Secondary | ICD-10-CM

## 2016-10-22 MED ORDER — CLINDAMYCIN HCL 300 MG PO CAPS: 300.0000 mg | ORAL_CAPSULE | Freq: Three times a day (TID) | ORAL | 0 refills | Status: AC

## 2016-10-22 MED ORDER — ONDANSETRON HCL 4 MG/2ML IJ SOLN
4.0000 mg | Freq: Once | INTRAMUSCULAR | Status: AC
  Administered 2016-10-22: 4 mg via INTRAVENOUS
  Filled 2016-10-22: qty 2

## 2016-10-22 MED ORDER — OXYCODONE-ACETAMINOPHEN 5-325 MG PO TABS: 1.0000 | ORAL_TABLET | ORAL | 0 refills | Status: DC | PRN

## 2016-10-22 MED ORDER — KETOROLAC TROMETHAMINE 30 MG/ML IJ SOLN
30.0000 mg | Freq: Once | INTRAMUSCULAR | Status: AC
  Administered 2016-10-22: 30 mg via INTRAVENOUS
  Filled 2016-10-22: qty 1

## 2016-10-22 MED ORDER — SODIUM CHLORIDE 0.9 % IV BOLUS (SEPSIS)
1000.0000 mL | Freq: Once | INTRAVENOUS | Status: AC
  Administered 2016-10-22: 1000 mL via INTRAVENOUS

## 2016-10-22 NOTE — ED Provider Notes (Signed)
[facility] Emergency Department Provider Note   First MD Initiated Contact with Patient 10/22/16 0150     (approximate)  I have reviewed the triage vital signs and the nursing notes.   HISTORY  Chief Complaint Abdominal Pain   HPI Jillian Thomas is a 65 y.o. female with history of kidney stones presents with 8 out of 10 persistent right flank/right lower quadrant abdominal pain 3 days. Patient admits to nausea however no vomiting. Patient denies any diarrhea or constipation. Patient denies any fever afebrile on presentation. In addition the patient's concern about possible dental infection   Past medical history Kidney stones  Patient Active Problem List   Diagnosis Date Noted  . Palpitations 03/14/2016  . Obesity 03/14/2016    Past Surgical History:  Procedure Laterality Date  . CHOLECYSTECTOMY    . TUBAL LIGATION      Prior to Admission medications   Medication Sig Start Date End Date Taking? Authorizing Provider  albuterol (PROVENTIL HFA;VENTOLIN HFA) 108 (90 Base) MCG/ACT inhaler Inhale 2 puffs into the lungs every 6 (six) hours as needed for wheezing or shortness of breath. 05/30/16   Myrna Blazeravid Matthew Schaevitz, MD  chlorpheniramine-HYDROcodone [facility](TUSSIONEX PENNKINETIC ER) 10-8 MG/5ML SUER Take 5 mLs by mouth 2 (two) times daily. 06/17/16   Irean HongJade J Sung, MD  clindamycin (CLEOCIN) 300 MG capsule Take 1 capsule (300 mg total) by mouth 3 (three) times daily. 10/22/16 11/01/16  Darci Currentandolph N Mirel Hundal, MD  loratadine (CLARITIN) 10 MG tablet Take 1 tablet (10 mg total) by mouth daily. 04/18/16   Jami L Hagler, PA-C  magic mouthwash SOLN Take 5 mLs by mouth 4 (four) times daily as needed for mouth pain. Please mix 60mL of each ingredient totaling 180mL 04/18/16   Jami L Hagler, PA-C  nitroGLYCERIN (NITROSTAT) 0.4 MG SL tablet Place 1 tablet (0.4 mg total) under the tongue every 5 (five) minutes as needed for chest pain. 02/05/16   Almond LintAileen Ingal, MD  ondansetron  (ZOFRAN) 4 MG tablet Take 1 tablet (4 mg total) by mouth every 8 (eight) hours as needed. 06/01/16   Phineas SemenGraydon Goodman, MD  oxyCODONE-acetaminophen (ROXICET) 5-325 MG tablet Take 1 tablet by mouth every 4 (four) hours as needed for severe pain. 10/22/16   Darci Currentandolph N Eretria Manternach, MD  predniSONE (DELTASONE) 20 MG tablet Take 3 tablets (60 mg total) by mouth daily. 05/31/16 05/31/17  Myrna Blazeravid Matthew Schaevitz, MD  propranolol (INDERAL) 10 MG tablet Take 1 tablet (10 mg total) by mouth 2 (two) times daily. Patient not taking: Reported on 02/05/2016 01/15/16 01/29/16  Jennye MoccasinBrian S Quigley, MD    Allergies Patient has no known allergies.  No family history on file.  Social History Social History  Substance Use Topics  . Smoking status: Never Smoker  . Smokeless tobacco: Never Used  . Alcohol use No    Review of Systems Constitutional: No fever/chills Eyes: No visual changes. ENT: No sore throat. Cardiovascular: Denies chest pain. Respiratory: Denies shortness of breath. Gastrointestinal: Positive for right flank/right lower quadrant pain.  No nausea, no vomiting.  No diarrhea.  No constipation. Genitourinary: Negative for dysuria. Musculoskeletal: Negative for back pain. Skin: Negative for rash. Neurological: Negative for headaches, focal weakness or numbness.  10-point ROS otherwise negative.  ____________________________________________   PHYSICAL EXAM:  VITAL SIGNS: ED Triage Vitals  Enc Vitals Group     BP 10/21/16 2140 (!) 176/99     Pulse Rate 10/21/16 2140 99     Resp 10/21/16 2140 18  Temp 10/21/16 2140 98 F (36.7 C)     Temp Source 10/21/16 2140 Oral     SpO2 10/21/16 2140 95 %     Weight 10/21/16 2141 190 lb (86.2 kg)     Height 10/21/16 2141 5\' 3"  (1.6 m)     Head Circumference --      Peak Flow --      Pain Score 10/21/16 2141 7     Pain Loc --      Pain Edu? --      Excl. in GC? --     Constitutional: Alert and oriented. Well appearing and in no acute distress. Eyes:  Conjunctivae are normal. PERRL. EOMI. Head: Atraumatic. Mouth/Throat: Mucous membranes are moist.  Oropharynx non-erythematous.Very poor dentition erythematous gingiva Neck: No stridor. Cardiovascular: Normal rate, regular rhythm. Good peripheral circulation. Grossly normal heart sounds. Respiratory: Normal respiratory effort.  No retractions. Lungs CTAB. Gastrointestinal: Soft and nontender. No distention.  Musculoskeletal: No lower extremity tenderness nor edema. No gross deformities of extremities. Neurologic:  Normal speech and language. No gross focal neurologic deficits are appreciated.  Skin:  Skin is warm, dry and intact. No rash noted. Psychiatric: Mood and affect are normal. Speech and behavior are normal.  ____________________________________________   LABS (all labs ordered are listed, but only abnormal results are displayed)  Labs Reviewed  COMPREHENSIVE METABOLIC PANEL - Abnormal; Notable for the following:       Result Value   Potassium 3.4 (*)    Glucose, Bld 147 (*)    All other components within normal limits  URINALYSIS, COMPLETE (UACMP) WITH MICROSCOPIC - Abnormal; Notable for the following:    Color, Urine YELLOW (*)    APPearance HAZY (*)    Hgb urine dipstick SMALL (*)    Leukocytes, UA TRACE (*)    Squamous Epithelial / LPF 0-5 (*)    All other components within normal limits  LIPASE, BLOOD  CBC     RADIOLOGY I, [place] Mayleigh Tetrault, personally viewed and evaluated these images (plain radiographs) as part of my medical decision making, as well as reviewing the written report by the radiologist.  Ct Renal Stone Study  Result Date: 10/22/2016 CLINICAL DATA:  Right flank pain and nausea EXAM: CT ABDOMEN AND PELVIS WITHOUT CONTRAST TECHNIQUE: Multidetector CT imaging of the abdomen and pelvis was performed following the standard protocol without IV contrast. COMPARISON:  CT abdomen pelvis 11/20/2015 and 08/11/2005 FINDINGS: Lower chest: No pulmonary nodules or  pleural effusion. No visible pericardial effusion. Hepatobiliary: Normal noncontrast appearance of the liver. No visible biliary dilatation. Status post cholecystectomy. Pancreas: Normal noncontrast appearance of the pancreas. No peripancreatic fluid collection. Spleen: Normal. Adrenal glands: Normal. Urinary Tract: --Right kidney: No hydronephrosis or perinephric stranding. No nephrolithiasis. No obstructing ureteral stones. --Left kidney: No hydronephrosis. Calcifications at the renal hilum may be vascular. There is an intermediate attenuation lesion arising from the lower pole of the left kidney, measuring 1.5 cm. This is unchanged compared to 11/20/2015 and even as far back as 08/11/2005. There is no evidence of ureteral obstruction. --Urinary bladder: Unremarkable. Stomach/Bowel: No dilated loops of bowel. No evidence of colonic or enteric inflammation. Normal appendix. Vascular/Lymphatic: There is atherosclerotic calcification of the non aneurysmal abdominal aorta. Reproductive: Normal uterus and ovaries. Musculoskeletal. No focal osseous lesion. Normal visualized extraperitoneal and extrathoracic soft tissues. IMPRESSION: No acute abnormality of the abdomen or pelvis. Specifically, no obstructive uropathy. Electronically Signed   By: Deatra RobinsonKevin  Herman M.D.   On: 10/22/2016 03:11  ____________________________________________    Procedures     INITIAL IMPRESSION / ASSESSMENT AND PLAN / ED COURSE  Pertinent labs & imaging results that were available during my care of the patient were reviewed by me and considered in my medical decision making (see chart for details).  Patient given Toradol 30 mg with improvement of pain current pain score is 3 out of 10. No clear etiology noted for the patient's right flank/right lower quadrant pain   Clinical Course     ____________________________________________  FINAL CLINICAL IMPRESSION(S) / ED DIAGNOSES  Final diagnoses:  Right lower quadrant  abdominal pain     MEDICATIONS GIVEN DURING THIS VISIT:  Medications  ketorolac (TORADOL) 30 MG/ML injection 30 mg (30 mg Intravenous Given 10/22/16 0300)  ondansetron (ZOFRAN) injection 4 mg (4 mg Intravenous Given 10/22/16 0300)  sodium chloride 0.9 % bolus 1,000 mL (1,000 mLs Intravenous New Bag/Given 10/22/16 0259)     NEW OUTPATIENT MEDICATIONS STARTED DURING THIS VISIT:  New Prescriptions   CLINDAMYCIN (CLEOCIN) 300 MG CAPSULE    Take 1 capsule (300 mg total) by mouth 3 (three) times daily.   OXYCODONE-ACETAMINOPHEN (ROXICET) 5-325 MG TABLET    Take 1 tablet by mouth every 4 (four) hours as needed for severe pain.    Modified Medications   No medications on file    Discontinued Medications   No medications on file     Note:  This document was prepared using Dragon voice recognition software and may include unintentional dictation errors.    Darci Current, MD 10/22/16 [phone]

## 2016-11-15 ENCOUNTER — Encounter: Payer: Self-pay | Admitting: Emergency Medicine

## 2016-11-15 ENCOUNTER — Emergency Department
Admission: EM | Admit: 2016-11-15 | Discharge: 2016-11-15 | Disposition: A | Discharge status: Home / Self Care | Payer: Self-pay | Attending: Emergency Medicine | Admitting: Emergency Medicine

## 2016-11-15 DIAGNOSIS — Z79899 Other long term (current) drug therapy: Secondary | ICD-10-CM | POA: Insufficient documentation

## 2016-11-15 DIAGNOSIS — N390 Urinary tract infection, site not specified: Secondary | ICD-10-CM | POA: Insufficient documentation

## 2016-11-15 LAB — CBC
HCT: 41 % (ref 35.0–47.0)
Hemoglobin: 13.9 g/dL (ref 12.0–16.0)
MCH: 29.2 pg (ref 26.0–34.0)
MCHC: 34 g/dL (ref 32.0–36.0)
MCV: 86.1 fL (ref 80.0–100.0)
Platelets: 204 10*3/uL (ref 150–440)
RBC: 4.77 MIL/uL (ref 3.80–5.20)
RDW: 13.6 % (ref 11.5–14.5)
WBC: 10.3 10*3/uL (ref 3.6–11.0)

## 2016-11-15 LAB — URINALYSIS, COMPLETE (UACMP) WITH MICROSCOPIC
BILIRUBIN URINE: NEGATIVE
GLUCOSE, UA: NEGATIVE mg/dL
KETONES UR: NEGATIVE mg/dL
Nitrite: NEGATIVE
PROTEIN: 30 mg/dL — AB
Specific Gravity, Urine: 1.01 (ref 1.005–1.030)
pH: 5 (ref 5.0–8.0)

## 2016-11-15 LAB — BASIC METABOLIC PANEL
Anion gap: 6 (ref 5–15)
BUN: 16 mg/dL (ref 6–20)
CALCIUM: 9.2 mg/dL (ref 8.9–10.3)
CO2: 25 mmol/L (ref 22–32)
CREATININE: 0.91 mg/dL (ref 0.44–1.00)
Chloride: 106 mmol/L (ref 101–111)
GFR calc non Af Amer: >60 mL/min (ref >60)
Glucose, Bld: 117 mg/dL — ABNORMAL HIGH (ref 65–99)
Potassium: 3.9 mmol/L (ref 3.5–5.1)
SODIUM: 137 mmol/L (ref 135–145)

## 2016-11-15 MED ORDER — CEPHALEXIN 500 MG PO CAPS: 500.0000 mg | ORAL_CAPSULE | Freq: Two times a day (BID) | ORAL | 0 refills | Status: DC

## 2016-11-15 NOTE — ED Triage Notes (Signed)
Pt c/o dysuria and hematuria starting last night. Denies use of blood thinners.

## 2016-11-15 NOTE — ED Notes (Signed)
Lab notified to add on urine culture 

## 2016-11-15 NOTE — ED Provider Notes (Signed)
[facility] Emergency Department Provider Note   ____________________________________________    I have reviewed the triage vital signs and the nursing notes.   HISTORY  Chief Complaint Hematuria     HPI Jillian C Sharen HonesGutierrez is a 66 y.o. female who presents with complaints of dysuria and noted hematuria this morning. She denies flank pain. She reports burning with urination. She reports she has had a urinary tract infection but only once before. She denies fevers or chills. Otherwise she feels well.   History reviewed. No pertinent past medical history.  Patient Active Problem List   Diagnosis Date Noted  . Palpitations 03/14/2016  . Obesity 03/14/2016    Past Surgical History:  Procedure Laterality Date  . CHOLECYSTECTOMY    . TUBAL LIGATION      Prior to Admission medications   Medication Sig Start Date End Date Taking? Authorizing Provider  albuterol (PROVENTIL HFA;VENTOLIN HFA) 108 (90 Base) MCG/ACT inhaler Inhale 2 puffs into the lungs every 6 (six) hours as needed for wheezing or shortness of breath. 05/30/16   Myrna Blazeravid Matthew Schaevitz, MD  cephALEXin (KEFLEX) 500 MG capsule Take 1 capsule (500 mg total) by mouth 2 (two) times daily. 11/15/16   Jene Everyobert Amer Alcindor, MD  chlorpheniramine-HYDROcodone [facility](TUSSIONEX PENNKINETIC ER) 10-8 MG/5ML SUER Take 5 mLs by mouth 2 (two) times daily. 06/17/16   Irean HongJade J Sung, MD  loratadine (CLARITIN) 10 MG tablet Take 1 tablet (10 mg total) by mouth daily. 04/18/16   Jami L Hagler, PA-C  magic mouthwash SOLN Take 5 mLs by mouth 4 (four) times daily as needed for mouth pain. Please mix 60mL of each ingredient totaling 180mL 04/18/16   Jami L Hagler, PA-C  nitroGLYCERIN (NITROSTAT) 0.4 MG SL tablet Place 1 tablet (0.4 mg total) under the tongue every 5 (five) minutes as needed for chest pain. 02/05/16   Almond LintAileen Ingal, MD  ondansetron (ZOFRAN) 4 MG tablet Take 1 tablet (4 mg total) by mouth every 8 (eight) hours as needed. 06/01/16    Phineas SemenGraydon Goodman, MD  oxyCODONE-acetaminophen (ROXICET) 5-325 MG tablet Take 1 tablet by mouth every 4 (four) hours as needed for severe pain. 10/22/16   Darci Currentandolph N Brown, MD  predniSONE (DELTASONE) 20 MG tablet Take 3 tablets (60 mg total) by mouth daily. 05/31/16 05/31/17  Myrna Blazeravid Matthew Schaevitz, MD  propranolol (INDERAL) 10 MG tablet Take 1 tablet (10 mg total) by mouth 2 (two) times daily. Patient not taking: Reported on 02/05/2016 01/15/16 01/29/16  Jennye MoccasinBrian S Quigley, MD     Allergies Patient has no known allergies.  History reviewed. No pertinent family history.  Social History Social History  Substance Use Topics  . Smoking status: Never Smoker  . Smokeless tobacco: Never Used  . Alcohol use No    Review of Systems  Constitutional: No fever/chills     Gastrointestinal: No abdominal pain. No flank pain Genitourinary: As above Musculoskeletal: Negative for back pain. Skin: Negative for rash.     ____________________________________________   PHYSICAL EXAM:  VITAL SIGNS: ED Triage Vitals  Enc Vitals Group     BP 11/15/16 1028 122/86     Pulse Rate 11/15/16 1028 90     Resp 11/15/16 1028 20     Temp 11/15/16 1028 98.4 F (36.9 C)     Temp Source 11/15/16 1028 Oral     SpO2 11/15/16 1028 95 %     Weight 11/15/16 1028 190 lb (86.2 kg)     Height 11/15/16 1028 5\' 3"  (  1.6 m)     Head Circumference --      Peak Flow --      Pain Score 11/15/16 1029 6     Pain Loc --      Pain Edu? --      Excl. in GC? --     Constitutional: Alert and oriented. No acute distress. Pleasant and interactive Eyes: Conjunctivae are normal.   Nose: No congestion/rhinnorhea. Mouth/Throat: Mucous membranes are moist.   Cardiovascular: Normal rate, regular rhythm.  Respiratory: Normal respiratory effort.  No retractions. GI: No tenderness to palpation, no CVA tenderness  Neurologic:  Normal speech and language. No gross focal neurologic deficits are appreciated.   Skin:  Skin is  warm, dry and intact. No rash noted.   ____________________________________________   LABS (all labs ordered are listed, but only abnormal results are displayed)  Labs Reviewed  URINALYSIS, COMPLETE (UACMP) WITH MICROSCOPIC - Abnormal; Notable for the following:       Result Value   Color, Urine YELLOW (*)    APPearance CLOUDY (*)    Hgb urine dipstick LARGE (*)    Protein, ur 30 (*)    Leukocytes, UA LARGE (*)    Bacteria, UA RARE (*)    Squamous Epithelial / LPF 0-5 (*)    Non Squamous Epithelial 0-5 (*)    All other components within normal limits  BASIC METABOLIC PANEL - Abnormal; Notable for the following:    Glucose, Bld 117 (*)    All other components within normal limits  URINE CULTURE  CBC   ____________________________________________  EKG   ____________________________________________  RADIOLOGY  None ____________________________________________   PROCEDURES  Procedure(s) performed:No    Critical Care performed: No ____________________________________________   INITIAL IMPRESSION / ASSESSMENT AND PLAN / ED COURSE  Pertinent labs & imaging results that were available during my care of the patient were reviewed by me and considered in my medical decision making (see chart for details).  Patient presents with symptoms of urinary tract infection, urinalysis confirms the diagnosis. Lab work is unremarkable. No flank pain or concern for kidney stones. We will treat with Keflex, urine culture sent.   ____________________________________________   FINAL CLINICAL IMPRESSION(S) / ED DIAGNOSES  Final diagnoses:  Lower urinary tract infectious disease      NEW MEDICATIONS STARTED DURING THIS VISIT:  New Prescriptions   CEPHALEXIN (KEFLEX) 500 MG CAPSULE    Take 1 capsule (500 mg total) by mouth 2 (two) times daily.     Note:  This document was prepared using Dragon voice recognition software and may include unintentional dictation errors.      Jene Every, MD 11/15/16 1200

## 2016-11-18 LAB — URINE CULTURE

## 2018-07-30 IMAGING — CT CT RENAL STONE PROTOCOL
2 of 4 series · 16 of 46 positions shown, 18 images · non-contrast
Comparison: CT abdomen pelvis 11/20/2015 and 08/11/2005

CLINICAL DATA: Right flank pain and nausea

EXAM:
CT ABDOMEN AND PELVIS WITHOUT CONTRAST
TECHNIQUE: Multidetector CT imaging of the abdomen and pelvis was performed
following the standard protocol without IV contrast.

[Series 2: stone full standard · axial · 0.84mm/px · z∈[-1048,-598]mm · 13 of 100 slices shown, 15 images]
[im 5/100  soft-tissue]
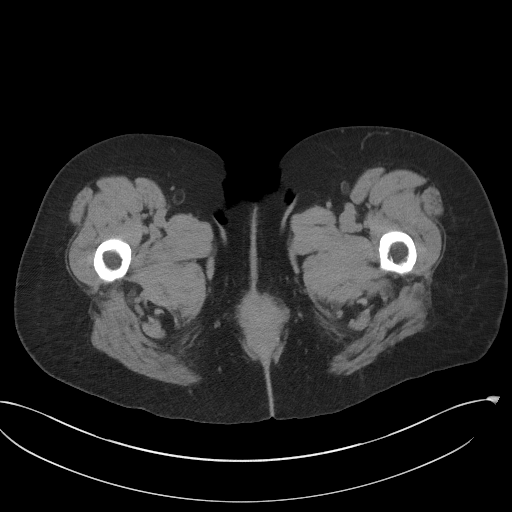
[im 5/100  bone]
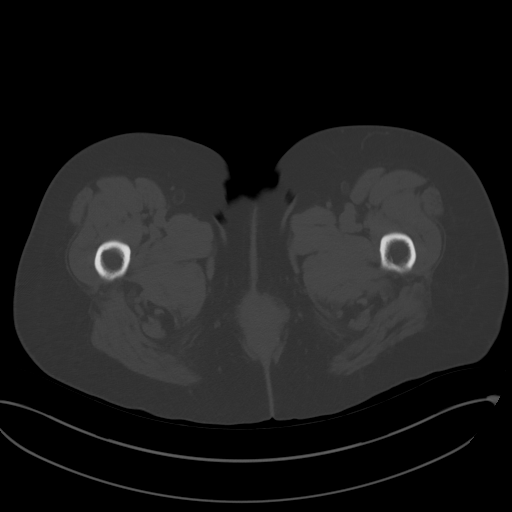
[im 13/100  soft-tissue]
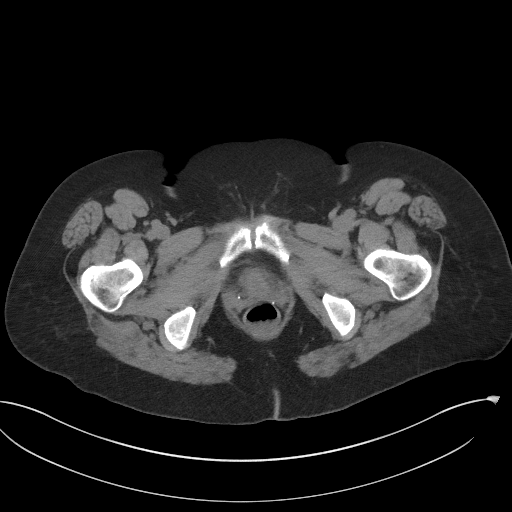
[im 21/100  soft-tissue]
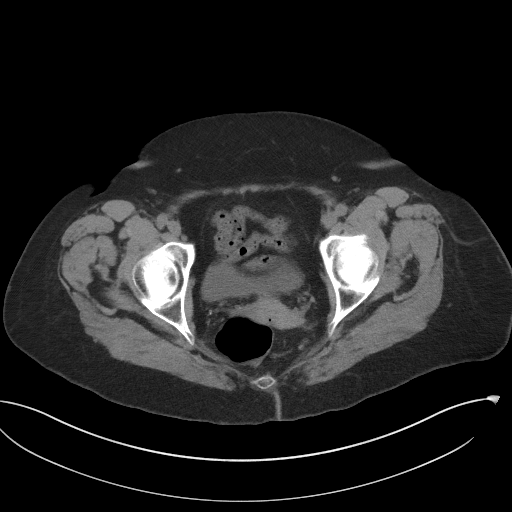
[im 29/100  soft-tissue]
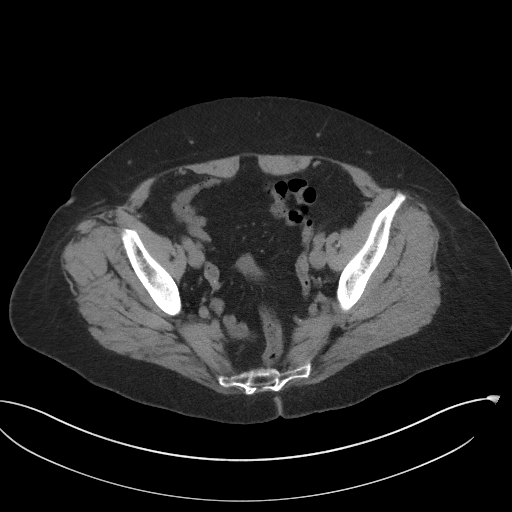
[im 34/100  soft-tissue]
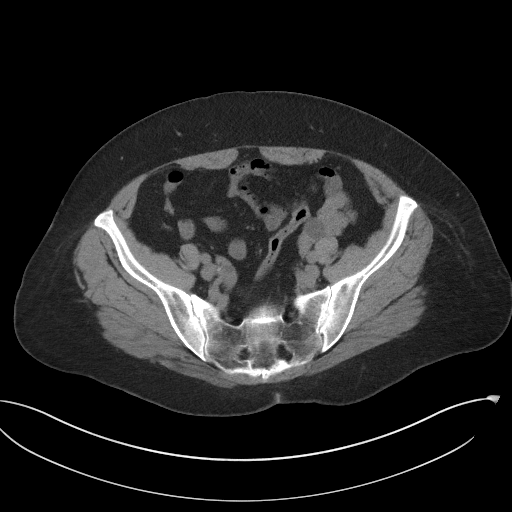
[im 42/100  soft-tissue]
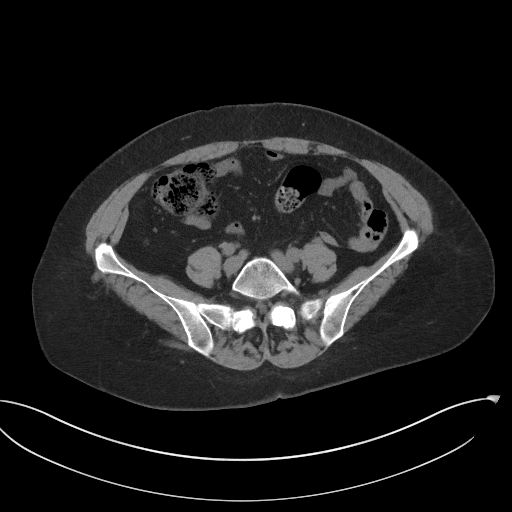
[im 50/100  soft-tissue]
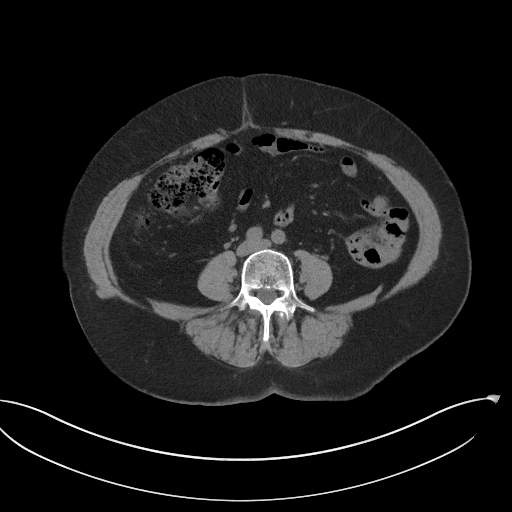
[im 58/100  soft-tissue]
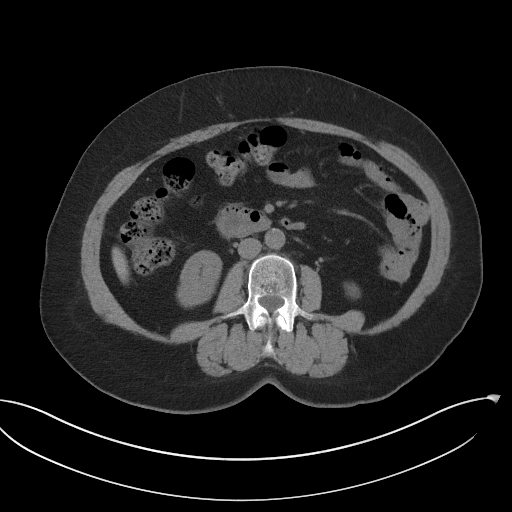
[im 67/100  soft-tissue]
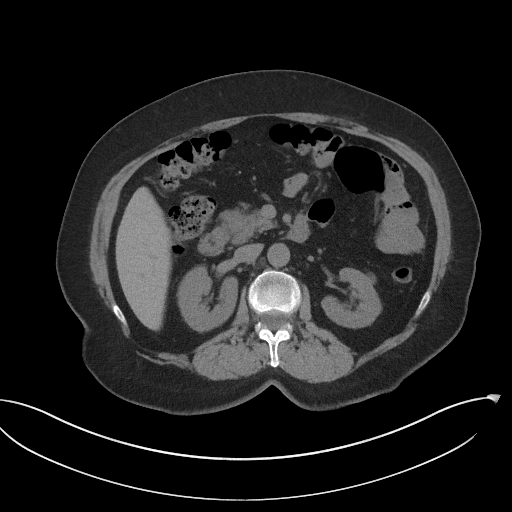
[im 67/100  bone]
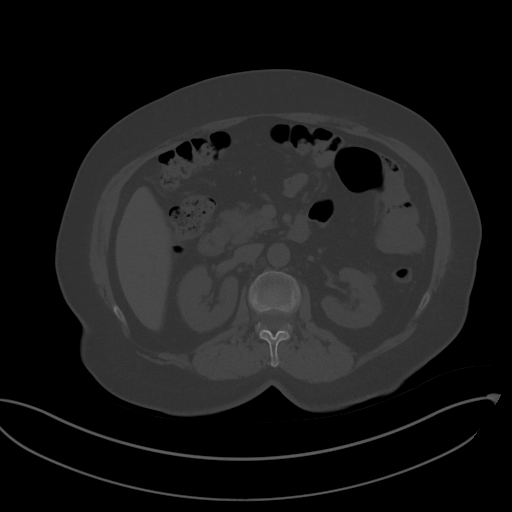
[im 71/100  soft-tissue]
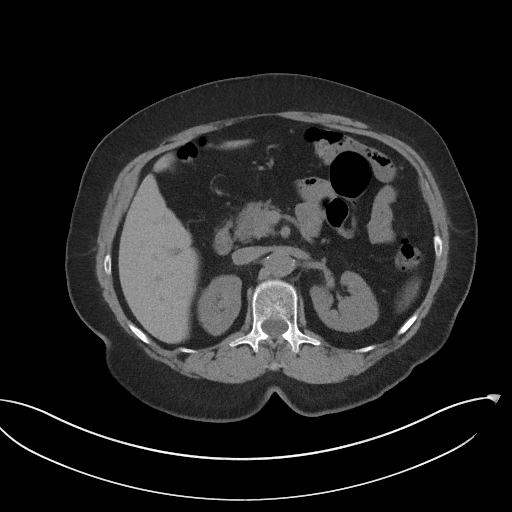
[im 79/100  soft-tissue]
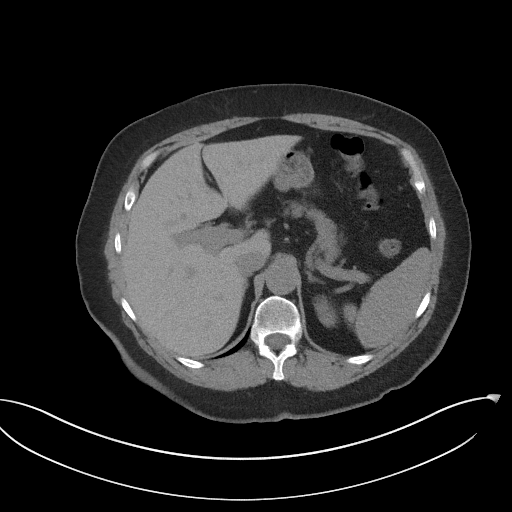
[im 87/100  soft-tissue]
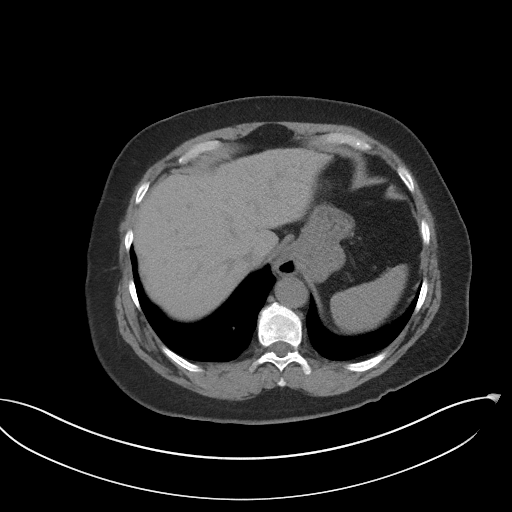
[im 95/100  soft-tissue]
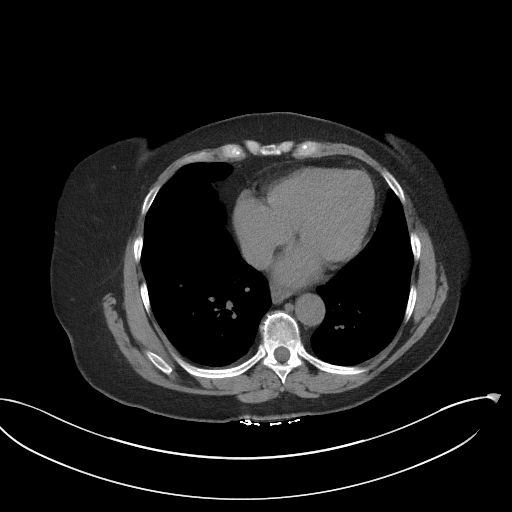

[Series 5: coronal · coronal · 0.84mm/px · 3 of 142 slices shown]
[im 48/142  soft-tissue]
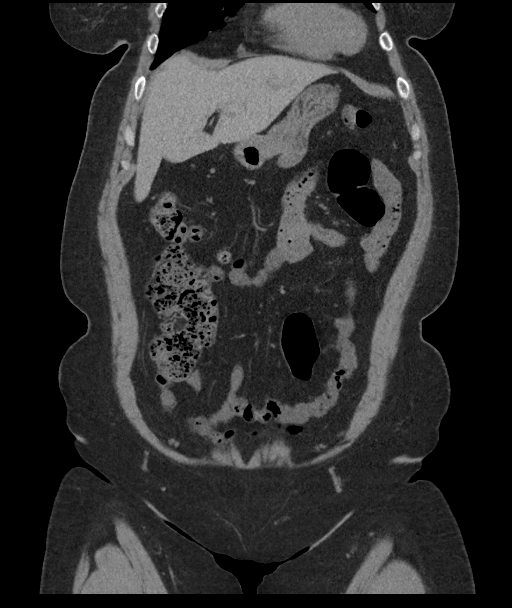
[im 63/142  soft-tissue]
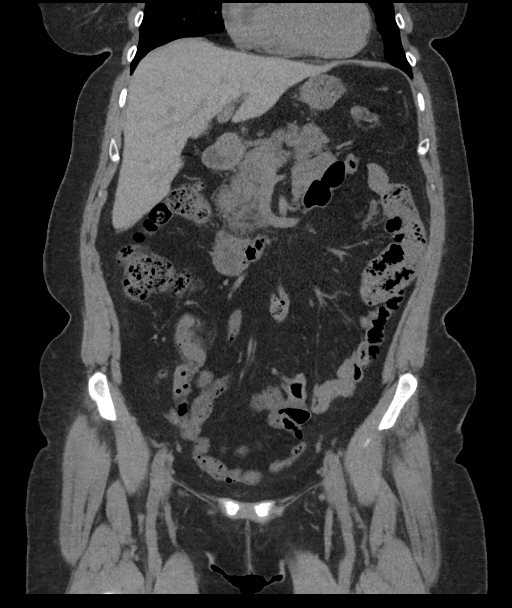
[im 79/142  soft-tissue]
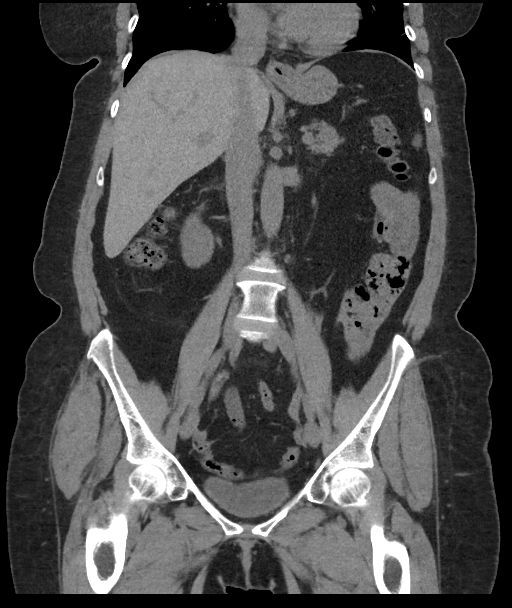

[16 of 46 positions shown; findings below may reference images not displayed]

FINDINGS: Lower chest: No pulmonary nodules or pleural effusion. No visible
pericardial effusion.

Hepatobiliary: Normal noncontrast appearance of the liver. No
visible biliary dilatation. Status post cholecystectomy.

Pancreas: Normal noncontrast appearance of the pancreas. No
peripancreatic fluid collection.

Spleen: Normal.

Adrenal glands: Normal.

Urinary Tract:

--Right kidney: No hydronephrosis or perinephric stranding. No
nephrolithiasis. No obstructing ureteral stones.

--Left kidney: No hydronephrosis. Calcifications at the renal hilum
may be vascular. There is an intermediate attenuation lesion arising
from the lower pole of the left kidney, measuring 1.5 cm. This is
unchanged compared to 11/20/2015 and even as far back as 08/11/2005.
There is no evidence of ureteral obstruction.

--Urinary bladder: Unremarkable.

Stomach/Bowel: No dilated loops of bowel. No evidence of colonic or
enteric inflammation. Normal appendix.

Vascular/Lymphatic: There is atherosclerotic calcification of the
non aneurysmal abdominal aorta.

Reproductive: Normal uterus and ovaries.

Musculoskeletal. No focal osseous lesion. Normal visualized
extraperitoneal and extrathoracic soft tissues.
IMPRESSION: No acute abnormality of the abdomen or pelvis. Specifically, no
obstructive uropathy.

## 2018-11-09 ENCOUNTER — Emergency Department
Admission: EM | Admit: 2018-11-09 | Discharge: 2018-11-09 | Disposition: A | Discharge status: Home / Self Care | Payer: Self-pay | Attending: Emergency Medicine | Admitting: Emergency Medicine

## 2018-11-09 ENCOUNTER — Emergency Department: Payer: Self-pay

## 2018-11-09 ENCOUNTER — Encounter: Payer: Self-pay | Admitting: Emergency Medicine

## 2018-11-09 ENCOUNTER — Other Ambulatory Visit: Payer: Self-pay

## 2018-11-09 DIAGNOSIS — Z79899 Other long term (current) drug therapy: Secondary | ICD-10-CM | POA: Insufficient documentation

## 2018-11-09 DIAGNOSIS — R0600 Dyspnea, unspecified: Secondary | ICD-10-CM | POA: Insufficient documentation

## 2018-11-09 LAB — BASIC METABOLIC PANEL
ANION GAP: 7 (ref 5–15)
BUN: 10 mg/dL (ref 8–23)
CALCIUM: 9.3 mg/dL (ref 8.9–10.3)
CHLORIDE: 106 mmol/L (ref 98–111)
CO2: 25 mmol/L (ref 22–32)
CREATININE: 0.78 mg/dL (ref 0.44–1.00)
GFR calc non Af Amer: >60 mL/min (ref >60)
Glucose, Bld: 117 mg/dL — ABNORMAL HIGH (ref 70–99)
Potassium: 4.2 mmol/L (ref 3.5–5.1)
SODIUM: 138 mmol/L (ref 135–145)

## 2018-11-09 LAB — CBC
HEMATOCRIT: 42.9 % (ref 36.0–46.0)
Hemoglobin: 14.3 g/dL (ref 12.0–15.0)
MCH: 29 pg (ref 26.0–34.0)
MCHC: 33.3 g/dL (ref 30.0–36.0)
MCV: 87 fL (ref 80.0–100.0)
NRBC: 0 % (ref 0.0–0.2)
PLATELETS: 232 10*3/uL (ref 150–400)
RBC: 4.93 MIL/uL (ref 3.87–5.11)
RDW: 13.2 % (ref 11.5–15.5)
WBC: 6 10*3/uL (ref 4.0–10.5)

## 2018-11-09 LAB — TROPONIN I
Troponin I: <0.03 ng/mL (ref <0.03)
Troponin I: <0.03 ng/mL (ref <0.03)

## 2018-11-09 NOTE — ED Notes (Signed)
Bedside cardiac monitor on. NSR 62.

## 2018-11-09 NOTE — ED Triage Notes (Signed)
Says she had resp illness several days ago/(maybe flu) with sore throat and head congestion and cough.  Says today she felt short of breath while walking about noon today.  Says it got better with rest.

## 2018-11-09 NOTE — ED Provider Notes (Signed)
[facility] Emergency Department Provider Note  Time seen: 5:02 PM  I have reviewed the triage vital signs and the nursing notes.   HISTORY  Chief Complaint Cough and Shortness of Breath    HPI Jillian Thomas is a 67 y.o. female with a past medical history of palpitations, presents to the emergency department for shortness of breath.  According to the patient she has been sick for the past week or so with upper respiratory symptoms cough congestion shortness of breath.  Patient states she was feeling much better today, so she went outside to take her dogs for a walk.  States while walking she became very short of breath which lasted approximately 30 minutes before improving.  Denies any chest pain.  No leg pain or swelling.  Patient states she is not sure if she just overdid it, but wanted to be safe so she came to the ED.  Currently patient denies any symptoms.  States she was symptom-free upon arrival is been in the emergency department 3 hours and denies any further shortness of breath denies any chest pain at any point.  No nausea.  Largely negative review of systems besides upper respiratory symptoms.   History reviewed. No pertinent past medical history.  Patient Active Problem List   Diagnosis Date Noted  . Palpitations 03/14/2016  . Obesity 03/14/2016    Past Surgical History:  Procedure Laterality Date  . CHOLECYSTECTOMY    . TUBAL LIGATION      Prior to Admission medications   Medication Sig Start Date End Date Taking? Authorizing Provider  albuterol (PROVENTIL HFA;VENTOLIN HFA) 108 (90 Base) MCG/ACT inhaler Inhale 2 puffs into the lungs every 6 (six) hours as needed for wheezing or shortness of breath. 05/30/16   Schaevitz, Myra Rudeavid Matthew, MD  cephALEXin (KEFLEX) 500 MG capsule Take 1 capsule (500 mg total) by mouth 2 (two) times daily. 11/15/16   Jene EveryKinner, Robert, MD  chlorpheniramine-HYDROcodone [facility](TUSSIONEX PENNKINETIC ER) 10-8 MG/5ML SUER Take 5 mLs  by mouth 2 (two) times daily. 06/17/16   Irean HongSung, Jade J, MD  loratadine (CLARITIN) 10 MG tablet Take 1 tablet (10 mg total) by mouth daily. 04/18/16   Hagler, Jami L, PA-C  magic mouthwash SOLN Take 5 mLs by mouth 4 (four) times daily as needed for mouth pain. Please mix 60mL of each ingredient totaling 180mL 04/18/16   Hagler, Jami L, PA-C  nitroGLYCERIN (NITROSTAT) 0.4 MG SL tablet Place 1 tablet (0.4 mg total) under the tongue every 5 (five) minutes as needed for chest pain. 02/05/16   Almond LintIngal, Aileen, MD  ondansetron (ZOFRAN) 4 MG tablet Take 1 tablet (4 mg total) by mouth every 8 (eight) hours as needed. 06/01/16   Phineas SemenGoodman, Graydon, MD  oxyCODONE-acetaminophen (ROXICET) 5-325 MG tablet Take 1 tablet by mouth every 4 (four) hours as needed for severe pain. 10/22/16   Darci CurrentBrown, [place] N, MD  propranolol (INDERAL) 10 MG tablet Take 1 tablet (10 mg total) by mouth 2 (two) times daily. Patient not taking: Reported on 02/05/2016 01/15/16 01/29/16  Jennye MoccasinQuigley, Brian S, MD    No Known Allergies  No family history on file.  Social History Social History   Tobacco Use  . Smoking status: Never Smoker  . Smokeless tobacco: Never Used  Substance Use Topics  . Alcohol use: No  . Drug use: No    Review of Systems Constitutional: Negative for fever. ENT: Mild cough and congestion, largely resolved since yesterday per patient. Cardiovascular: Negative for chest pain.  Respiratory: Shortness of breath today while exerting herself.  Now resolved. Gastrointestinal: Negative for abdominal pain, vomiting Genitourinary: Negative for urinary compaints Musculoskeletal: Negative for leg pain or swelling. Skin: Negative for skin complaints  Neurological: Negative for headache All other ROS negative  ____________________________________________   PHYSICAL EXAM:  VITAL SIGNS: ED Triage Vitals  Enc Vitals Group     BP 11/09/18 1445 132/84     Pulse Rate 11/09/18 1445 66     Resp 11/09/18 1445 18     Temp 11/09/18  1445 98.1 F (36.7 C)     Temp Source 11/09/18 1445 Oral     SpO2 11/09/18 1445 95 %     Weight 11/09/18 1445 175 lb (79.4 kg)     Height 11/09/18 1445 5\' 3"  (1.6 m)     Head Circumference --      Peak Flow --      Pain Score 11/09/18 1448 0     Pain Loc --      Pain Edu? --      Excl. in GC? --    Constitutional: Alert and oriented. Well appearing and in no distress. Eyes: Normal exam ENT   Head: Normocephalic and atraumatic.   Mouth/Throat: Mucous membranes are moist. Cardiovascular: Normal rate, regular rhythm.  Respiratory: Normal respiratory effort without tachypnea nor retractions. Breath sounds are clear.  No wheeze rales or rhonchi. Gastrointestinal: Soft and nontender. No distention.   Musculoskeletal: Nontender with normal range of motion in all extremities. No lower extremity tenderness or edema. Neurologic:  Normal speech and language. No gross focal neurologic deficits  Skin:  Skin is warm, dry and intact.  Psychiatric: Mood and affect are normal.   ____________________________________________    EKG  EKG viewed and interpreted by myself shows a normal sinus rhythm at 64 bpm with a narrow QRS, normal axis, normal intervals, no concerning ST changes.  ____________________________________________    RADIOLOGY  Chest xray negative  ____________________________________________   INITIAL IMPRESSION / ASSESSMENT AND PLAN / ED COURSE  Pertinent labs & imaging results that were available during my care of the patient were reviewed by me and considered in my medical decision making (see chart for details).  Patient presents to the emergency department for shortness of breath while exerting herself walking her dogs today.  Patient states she has been sick with upper respiratory infection, just felt better yesterday and today.  Differential would include overexertion, ACS, pneumonia.  Patient's labs are reassuringly normal including a negative troponin.  Chest  x-ray is normal.  EKG is reassuring.  We will repeat a troponin.  As long as repeat troponin is negative anticipate likely discharge home with PCP follow-up.  Patient agreeable to plan of care.  Repeat troponin is negative.  We will discharge with PCP follow-up.  Patient agreeable to plan of care.  ____________________________________________   FINAL CLINICAL IMPRESSION(S) / ED DIAGNOSES  Dyspnea   Minna AntisPaduchowski, Yamel Bale, MD 11/09/18 [phone]

## 2018-11-09 NOTE — ED Notes (Signed)
[facility] Unlabored sats 95% at check in.

## 2019-05-09 ENCOUNTER — Emergency Department
Admission: EM | Admit: 2019-05-09 | Discharge: 2019-05-09 | Disposition: A | Discharge status: Home / Self Care | Payer: Self-pay | Attending: Emergency Medicine | Admitting: Emergency Medicine

## 2019-05-09 ENCOUNTER — Other Ambulatory Visit: Payer: Self-pay

## 2019-05-09 DIAGNOSIS — J028 Acute pharyngitis due to other specified organisms: Secondary | ICD-10-CM | POA: Insufficient documentation

## 2019-05-09 DIAGNOSIS — B9789 Other viral agents as the cause of diseases classified elsewhere: Secondary | ICD-10-CM | POA: Insufficient documentation

## 2019-05-09 DIAGNOSIS — J029 Acute pharyngitis, unspecified: Secondary | ICD-10-CM

## 2019-05-09 LAB — GROUP A STREP BY PCR: Group A Strep by PCR: NOT DETECTED

## 2019-05-09 MED ORDER — LIDOCAINE VISCOUS HCL 2 % MT SOLN
5.0000 mL | Freq: Once | OROMUCOSAL | Status: AC
  Administered 2019-05-09: 5 mL via OROMUCOSAL
  Filled 2019-05-09: qty 15

## 2019-05-09 MED ORDER — DIPHENHYDRAMINE HCL 12.5 MG/5ML PO ELIX
12.5000 mg | ORAL_SOLUTION | Freq: Once | ORAL | Status: AC
  Administered 2019-05-09: 12.5 mg via ORAL
  Filled 2019-05-09: qty 5

## 2019-05-09 MED ORDER — LIDOCAINE VISCOUS HCL 2 % MT SOLN: 5.0000 mL | Freq: Four times a day (QID) | OROMUCOSAL | 0 refills | Status: DC | PRN

## 2019-05-09 NOTE — ED Notes (Signed)

## 2019-05-09 NOTE — ED Triage Notes (Signed)
Pt arrives to ED via POV from home with c/o sore throat x8 days "off an on". Pt denies difficulty swallowing or managing oral secretions. Pt also reports right-sided ear pain x3 days. No c/o N/V/D; no SHOB or fever. No OTC medications taken PTA.

## 2019-05-09 NOTE — ED Provider Notes (Signed)
[facility] Emergency Department Provider Note   ____________________________________________   First MD Initiated Contact with Patient 05/09/19 1931     (approximate)  I have reviewed the triage vital signs and the nursing notes.   HISTORY  Chief Complaint Sore Throat    HPI Jillian Thomas is a 68 y.o. female patient complain of sore throat for 1 week.  Patient state mild discomfort with swallowing but able to tolerate food and fluids.  Patient does manages her secretions.  Patient also complain of right-sided ear pain for 3 days.  Patient denies any obvious signs and symptoms of upper respiratory infection.  Patient denies shortness of breath or fever.  Patient rates the pain as a 5/10.  Patient describes her pain as "sore".  No palliative measures for complaint.         History reviewed. No pertinent past medical history.  Patient Active Problem List   Diagnosis Date Noted  . Palpitations 03/14/2016  . Obesity 03/14/2016    Past Surgical History:  Procedure Laterality Date  . CHOLECYSTECTOMY    . TUBAL LIGATION      Prior to Admission medications   Medication Sig Start Date End Date Taking? Authorizing Provider  albuterol (PROVENTIL HFA;VENTOLIN HFA) 108 (90 Base) MCG/ACT inhaler Inhale 2 puffs into the lungs every 6 (six) hours as needed for wheezing or shortness of breath. 05/30/16   Schaevitz, Myra Rudeavid Matthew, MD  cephALEXin (KEFLEX) 500 MG capsule Take 1 capsule (500 mg total) by mouth 2 (two) times daily. 11/15/16   Jene EveryKinner, Robert, MD  chlorpheniramine-HYDROcodone [facility](TUSSIONEX PENNKINETIC ER) 10-8 MG/5ML SUER Take 5 mLs by mouth 2 (two) times daily. 06/17/16   Irean HongSung, Jade J, MD  lidocaine (XYLOCAINE) 2 % solution Use as directed 5 mLs in the mouth or throat every 6 (six) hours as needed for mouth pain. Swish and slow swallow. 05/09/19   Joni ReiningSmith, Aubrii Sharpless K, PA-C  loratadine (CLARITIN) 10 MG tablet Take 1 tablet (10 mg total) by mouth daily. 04/18/16    Hagler, Jami L, PA-C  magic mouthwash SOLN Take 5 mLs by mouth 4 (four) times daily as needed for mouth pain. Please mix 60mL of each ingredient totaling 180mL 04/18/16   Hagler, Jami L, PA-C  nitroGLYCERIN (NITROSTAT) 0.4 MG SL tablet Place 1 tablet (0.4 mg total) under the tongue every 5 (five) minutes as needed for chest pain. 02/05/16   Almond LintIngal, Aileen, MD  ondansetron (ZOFRAN) 4 MG tablet Take 1 tablet (4 mg total) by mouth every 8 (eight) hours as needed. 06/01/16   Phineas SemenGoodman, Graydon, MD  oxyCODONE-acetaminophen (ROXICET) 5-325 MG tablet Take 1 tablet by mouth every 4 (four) hours as needed for severe pain. 10/22/16   Darci CurrentBrown, [place] N, MD  propranolol (INDERAL) 10 MG tablet Take 1 tablet (10 mg total) by mouth 2 (two) times daily. Patient not taking: Reported on 02/05/2016 01/15/16 01/29/16  Jennye MoccasinQuigley, Brian S, MD    Allergies Patient has no known allergies.  No family history on file.  Social History Social History   Tobacco Use  . Smoking status: Never Smoker  . Smokeless tobacco: Never Used  Substance Use Topics  . Alcohol use: No  . Drug use: No    Review of Systems Constitutional: No fever/chills Eyes: No visual changes. ENT: Sore throat.  Right ear pain. Cardiovascular: Denies chest pain. Respiratory: Denies shortness of breath. Gastrointestinal: No abdominal pain.  No nausea, no vomiting.  No diarrhea.  No constipation. Genitourinary: Negative for dysuria. Musculoskeletal:  Negative for back pain. Skin: Negative for rash. Neurological: Negative for headaches, focal weakness or numbness.   ____________________________________________   PHYSICAL EXAM:  VITAL SIGNS: ED Triage Vitals  Enc Vitals Group     BP 05/09/19 1925 (!) 153/82     Pulse Rate 05/09/19 1925 84     Resp 05/09/19 1925 17     Temp 05/09/19 1925 99.1 F (37.3 C)     Temp Source 05/09/19 1925 Oral     SpO2 05/09/19 1925 100 %     Weight 05/09/19 1921 185 lb (83.9 kg)     Height 05/09/19 1921 5\' 3"   (1.6 m)     Head Circumference --      Peak Flow --      Pain Score 05/09/19 1921 5     Pain Loc --      Pain Edu? --      Excl. in GC? --    Constitutional: Alert and oriented. Well appearing and in no acute distress. Nose: No congestion/rhinnorhea. Mouth/Throat: Mucous membranes are moist.  Oropharynx erythematous. Neck: No stridor.   Hematological/Lymphatic/Immunilogical: No cervical lymphadenopathy. Cardiovascular: Normal rate, regular rhythm. Grossly normal heart sounds.  Good peripheral circulation. Respiratory: Normal respiratory effort.  No retractions. Lungs CTAB. Gastrointestinal: Soft and nontender. No distention. No abdominal bruits. No CVA tenderness. Musculoskeletal: No lower extremity tenderness nor edema.  No joint effusions. Neurologic:  Normal speech and language. No gross focal neurologic deficits are appreciated. No gait instability. Skin:  Skin is warm, dry and intact. No rash noted. Psychiatric: Mood and affect are normal. Speech and behavior are normal.  ____________________________________________   LABS (all labs ordered are listed, but only abnormal results are displayed)  Labs Reviewed  GROUP A STREP BY PCR   ____________________________________________  EKG   ____________________________________________  RADIOLOGY  ED MD interpretation:    Official radiology report(s): No results found.  ____________________________________________   PROCEDURES  Procedure(s) performed (including Critical Care):  Procedures   ____________________________________________   INITIAL IMPRESSION / ASSESSMENT AND PLAN / ED COURSE  As part of my medical decision making, I reviewed the following data within the electronic MEDICAL RECORD NUMBER     [name] was evaluated in Emergency Department on 05/09/2019 for the symptoms described in the history of present illness. She was evaluated in the context of the global COVID-19 pandemic, which necessitated  consideration that the patient might be at risk for infection with the SARS-CoV-2 virus that causes COVID-19. Institutional protocols and algorithms that pertain to the evaluation of patients at risk for COVID-19 are in a state of rapid change based on information released by regulatory bodies including the CDC and federal and state organizations. These policies and algorithms were followed during the patient's care in the ED.  Patient presents with 1 week of sore throat.  Patient rapid strep was negative.  Patient given discharge care instruction and advised to use viscous lidocaine for swish and swallow.  Patient vies follow-up PCP.          ____________________________________________   FINAL CLINICAL IMPRESSION(S) / ED DIAGNOSES  Final diagnoses:  Viral pharyngitis     ED Discharge Orders         Ordered    lidocaine (XYLOCAINE) 2 % solution  Every 6 hours PRN     05/09/19 2039           Note:  This document was prepared using Dragon voice recognition software and may include unintentional dictation errors.  Sable Feil, PA-C 05/09/19 2041    Arta Silence, MD 05/09/19 2253

## 2019-08-18 ENCOUNTER — Emergency Department: Payer: Self-pay

## 2019-08-18 ENCOUNTER — Encounter: Payer: Self-pay | Admitting: *Deleted

## 2019-08-18 ENCOUNTER — Other Ambulatory Visit: Payer: Self-pay

## 2019-08-18 ENCOUNTER — Emergency Department
Admission: EM | Admit: 2019-08-18 | Discharge: 2019-08-18 | Disposition: A | Discharge status: Home / Self Care | Payer: Self-pay | Attending: Emergency Medicine | Admitting: Emergency Medicine

## 2019-08-18 DIAGNOSIS — R519 Headache, unspecified: Secondary | ICD-10-CM | POA: Insufficient documentation

## 2019-08-18 DIAGNOSIS — J069 Acute upper respiratory infection, unspecified: Secondary | ICD-10-CM | POA: Insufficient documentation

## 2019-08-18 DIAGNOSIS — R1030 Lower abdominal pain, unspecified: Secondary | ICD-10-CM | POA: Insufficient documentation

## 2019-08-18 DIAGNOSIS — R350 Frequency of micturition: Secondary | ICD-10-CM | POA: Insufficient documentation

## 2019-08-18 DIAGNOSIS — Z20828 Contact with and (suspected) exposure to other viral communicable diseases: Secondary | ICD-10-CM | POA: Insufficient documentation

## 2019-08-18 DIAGNOSIS — H6503 Acute serous otitis media, bilateral: Secondary | ICD-10-CM | POA: Insufficient documentation

## 2019-08-18 DIAGNOSIS — J029 Acute pharyngitis, unspecified: Secondary | ICD-10-CM

## 2019-08-18 DIAGNOSIS — R05 Cough: Secondary | ICD-10-CM | POA: Insufficient documentation

## 2019-08-18 DIAGNOSIS — R300 Dysuria: Secondary | ICD-10-CM | POA: Insufficient documentation

## 2019-08-18 DIAGNOSIS — R5383 Other fatigue: Secondary | ICD-10-CM | POA: Insufficient documentation

## 2019-08-18 DIAGNOSIS — R0981 Nasal congestion: Secondary | ICD-10-CM | POA: Insufficient documentation

## 2019-08-18 DIAGNOSIS — Z79899 Other long term (current) drug therapy: Secondary | ICD-10-CM | POA: Insufficient documentation

## 2019-08-18 LAB — URINALYSIS, COMPLETE (UACMP) WITH MICROSCOPIC
Bilirubin Urine: NEGATIVE
Glucose, UA: NEGATIVE mg/dL
Ketones, ur: NEGATIVE mg/dL
Nitrite: NEGATIVE
Protein, ur: NEGATIVE mg/dL
Specific Gravity, Urine: 1.003 — ABNORMAL LOW (ref 1.005–1.030)
pH: 7 (ref 5.0–8.0)

## 2019-08-18 LAB — CBC
HCT: 42.8 % (ref 36.0–46.0)
Hemoglobin: 14.4 g/dL (ref 12.0–15.0)
MCH: 29.1 pg (ref 26.0–34.0)
MCHC: 33.6 g/dL (ref 30.0–36.0)
MCV: 86.6 fL (ref 80.0–100.0)
Platelets: 233 10*3/uL (ref 150–400)
RBC: 4.94 MIL/uL (ref 3.87–5.11)
RDW: 13.3 % (ref 11.5–15.5)
WBC: 9.3 10*3/uL (ref 4.0–10.5)
nRBC: 0 % (ref 0.0–0.2)

## 2019-08-18 LAB — BASIC METABOLIC PANEL
Anion gap: 9 (ref 5–15)
BUN: 12 mg/dL (ref 8–23)
CO2: 26 mmol/L (ref 22–32)
Calcium: 9.5 mg/dL (ref 8.9–10.3)
Chloride: 103 mmol/L (ref 98–111)
Creatinine, Ser: 0.88 mg/dL (ref 0.44–1.00)
GFR calc Af Amer: >60 mL/min (ref >60)
GFR calc non Af Amer: >60 mL/min (ref >60)
Glucose, Bld: 101 mg/dL — ABNORMAL HIGH (ref 70–99)
Potassium: 3.7 mmol/L (ref 3.5–5.1)
Sodium: 138 mmol/L (ref 135–145)

## 2019-08-18 MED ORDER — AMOXICILLIN-POT CLAVULANATE 875-125 MG PO TABS: 1.0000 | ORAL_TABLET | Freq: Two times a day (BID) | ORAL | 0 refills | Status: AC

## 2019-08-18 NOTE — ED Triage Notes (Addendum)
Pt presents w/ c/o fever, cough, sore throat, ear pain, and nausea since yesterday. Pt has taken no meds for pain or fever. Pt also c/o urinary sxs and low back pain starting last week. Pt c/o urgency and frequency since last week. Pt c/o pelvic pain, but the back pain is worse.

## 2019-08-18 NOTE — ED Notes (Signed)
Pt signed e signature.  D/c inst to pt.   Pt alert.   

## 2019-08-18 NOTE — ED Provider Notes (Signed)
[facility] Emergency Department Provider Note  ____________________________________________   First MD Initiated Contact with Patient 08/18/19 1649     (approximate)  I have reviewed the triage vital signs and the nursing notes.   HISTORY  Chief Complaint Dysuria and Sore Throat    HPI Jillian Thomas is a 68 y.o. female here with cough, sore throat, general fatigue.  The patient states that her symptoms started over the last 24 hours.  She states she woke up and felt a sore throat, sinus congestion, and sinus pressure.  She has a history of recurrent sinus infections.  She also noted some mild dysuria, and urinary frequency.  She states that she has a history of recurrent sinus infections.  She is had a mild, dry cough as well but denies any shortness of breath.  She subsequently presents for further evaluation.  She denies any known sick contacts.  No known coronavirus exposures.  She states she feels generally tired.  No abdominal pain.  No nausea or vomiting.  No diarrhea.  No recent travel.  She states she is concerned because she lives with her daughter, who has COPD.        History reviewed. No pertinent past medical history.  Patient Active Problem List   Diagnosis Date Noted  . Palpitations 03/14/2016  . Obesity 03/14/2016    Past Surgical History:  Procedure Laterality Date  . CHOLECYSTECTOMY    . TUBAL LIGATION      Prior to Admission medications   Medication Sig Start Date End Date Taking? Authorizing Provider  albuterol (PROVENTIL HFA;VENTOLIN HFA) 108 (90 Base) MCG/ACT inhaler Inhale 2 puffs into the lungs every 6 (six) hours as needed for wheezing or shortness of breath. 05/30/16   Schaevitz, Myra Rudeavid Matthew, MD  amoxicillin-clavulanate (AUGMENTIN) 875-125 MG tablet Take 1 tablet by mouth 2 (two) times daily for 10 days. 08/18/19 08/28/19  Shaune PollackIsaacs, Merian Wroe, MD  cephALEXin (KEFLEX) 500 MG capsule Take 1 capsule (500 mg total) by mouth 2  (two) times daily. 11/15/16   Jene EveryKinner, Robert, MD  chlorpheniramine-HYDROcodone [facility](TUSSIONEX PENNKINETIC ER) 10-8 MG/5ML SUER Take 5 mLs by mouth 2 (two) times daily. 06/17/16   Irean HongSung, Jade J, MD  lidocaine (XYLOCAINE) 2 % solution Use as directed 5 mLs in the mouth or throat every 6 (six) hours as needed for mouth pain. Swish and slow swallow. 05/09/19   Joni ReiningSmith, Ronald K, PA-C  loratadine (CLARITIN) 10 MG tablet Take 1 tablet (10 mg total) by mouth daily. 04/18/16   Hagler, Jami L, PA-C  magic mouthwash SOLN Take 5 mLs by mouth 4 (four) times daily as needed for mouth pain. Please mix 60mL of each ingredient totaling 180mL 04/18/16   Hagler, Jami L, PA-C  nitroGLYCERIN (NITROSTAT) 0.4 MG SL tablet Place 1 tablet (0.4 mg total) under the tongue every 5 (five) minutes as needed for chest pain. 02/05/16   Almond LintIngal, Aileen, MD  ondansetron (ZOFRAN) 4 MG tablet Take 1 tablet (4 mg total) by mouth every 8 (eight) hours as needed. 06/01/16   Phineas SemenGoodman, Graydon, MD  oxyCODONE-acetaminophen (ROXICET) 5-325 MG tablet Take 1 tablet by mouth every 4 (four) hours as needed for severe pain. 10/22/16   Darci CurrentBrown, IXL N, MD  propranolol (INDERAL) 10 MG tablet Take 1 tablet (10 mg total) by mouth 2 (two) times daily. Patient not taking: Reported on 02/05/2016 01/15/16 01/29/16  Jennye MoccasinQuigley, Brian S, MD    Allergies Patient has no known allergies.  History reviewed. No pertinent family  history.  Social History Social History   Tobacco Use  . Smoking status: Never Smoker  . Smokeless tobacco: Never Used  Substance Use Topics  . Alcohol use: No  . Drug use: No    Review of Systems  Review of Systems  Constitutional: Positive for fatigue. Negative for fever.  HENT: Positive for congestion, rhinorrhea and sore throat.   Eyes: Negative for visual disturbance.  Respiratory: Negative for cough and shortness of breath.   Cardiovascular: Negative for chest pain.  Gastrointestinal: Negative for abdominal pain, diarrhea, nausea and  vomiting.  Genitourinary: Negative for flank pain.  Musculoskeletal: Negative for back pain and neck pain.  Skin: Negative for rash and wound.  Neurological: Negative for weakness.     ____________________________________________  PHYSICAL EXAM:      VITAL SIGNS: ED Triage Vitals  Enc Vitals Group     BP 08/18/19 1607 (!) 144/102     Pulse Rate 08/18/19 1607 (!) 102     Resp 08/18/19 1607 16     Temp 08/18/19 1607 99.4 F (37.4 C)     Temp Source 08/18/19 1607 Oral     SpO2 08/18/19 1607 93 %     Weight 08/18/19 1615 190 lb (86.2 kg)     Height 08/18/19 1615 5\' 3"  (1.6 m)     Head Circumference --      Peak Flow --      Pain Score 08/18/19 1611 6     Pain Loc --      Pain Edu? --      Excl. in [place]? --      Physical Exam Vitals signs and nursing note reviewed.  Constitutional:      General: She is not in acute distress.    Appearance: She is well-developed.  HENT:     Head: Normocephalic and atraumatic.     Comments: Moderate posterior pharyngeal erythema with tonsillar swelling.  No exudates.  Sinus tenderness along maxillary sinuses bilaterally.  No facial erythema or swelling.    Right Ear: A middle ear effusion (Serous) is present.     Left Ear: A middle ear effusion (Serous) is present.  Eyes:     Conjunctiva/sclera: Conjunctivae normal.  Neck:     Musculoskeletal: Neck supple.  Cardiovascular:     Rate and Rhythm: Normal rate and regular rhythm.     Heart sounds: Normal heart sounds. No murmur. No friction rub.  Pulmonary:     Effort: Pulmonary effort is normal. No respiratory distress.     Breath sounds: Normal breath sounds. No wheezing or rales.  Abdominal:     General: There is no distension.     Palpations: Abdomen is soft.     Tenderness: There is no abdominal tenderness.     Comments: Minimal suprapubic tenderness, no CVA tenderness, no rebound or guarding  Skin:    General: Skin is warm.     Capillary Refill: Capillary refill takes less than 2  seconds.  Neurological:     Mental Status: She is alert and oriented to person, place, and time.     Motor: No abnormal muscle tone.       ____________________________________________   LABS (all labs ordered are listed, but only abnormal results are displayed)  Labs Reviewed  URINALYSIS, COMPLETE (UACMP) WITH MICROSCOPIC - Abnormal; Notable for the following components:      Result Value   Color, Urine STRAW (*)    APPearance HAZY (*)    Specific Gravity, Urine 1.003 (*)  Hgb urine dipstick SMALL (*)    Leukocytes,Ua TRACE (*)    Bacteria, UA RARE (*)    All other components within normal limits  BASIC METABOLIC PANEL - Abnormal; Notable for the following components:   Glucose, Bld 101 (*)    All other components within normal limits  URINE CULTURE  NOVEL CORONAVIRUS, NAA (HOSP ORDER, SEND-OUT TO REF LAB; TAT 18-24 HRS)  CBC    ____________________________________________  EKG: None ________________________________________  RADIOLOGY All imaging, including plain films, CT scans, and ultrasounds, independently reviewed by me, and interpretations confirmed via formal radiology reads.  ED MD interpretation:   Chest x-ray: Negative  Official radiology report(s): Dg Chest 2 View  Result Date: 08/18/2019 CLINICAL DATA:  Fever, cough and sore throat for 1 day. EXAM: CHEST - 2 VIEW COMPARISON:  11/09/2018 and prior radiographs FINDINGS: The cardiomediastinal silhouette is unremarkable. Mild chronic peribronchial thickening again noted. There is no evidence of focal airspace disease, pulmonary edema, suspicious pulmonary nodule/mass, pleural effusion, or pneumothorax. No acute bony abnormalities are identified. IMPRESSION: No active cardiopulmonary disease. Electronically Signed   By: Harmon Pier M.D.   On: 08/18/2019 17:13    ____________________________________________  PROCEDURES   Procedure(s) performed (including Critical Care):  Procedures   ____________________________________________  INITIAL IMPRESSION / MDM / ASSESSMENT AND PLAN / ED COURSE  As part of my medical decision making, I reviewed the following data within the electronic MEDICAL RECORD NUMBER       *Lutie C Fairbank was evaluated in Emergency Department on 08/18/2019 for the symptoms described in the history of present illness. She was evaluated in the context of the global COVID-19 pandemic, which necessitated consideration that the patient might be at risk for infection with the SARS-CoV-2 virus that causes COVID-19. Institutional protocols and algorithms that pertain to the evaluation of patients at risk for COVID-19 are in a state of rapid change based on information released by regulatory bodies including the CDC and federal and state organizations. These policies and algorithms were followed during the patient's care in the ED.  Some ED evaluations and interventions may be delayed as a result of limited staffing during the pandemic.*      Medical Decision Making: 68 year old female here with suspected URI/sinusitis, with history of same.  Patient also reporting some mild dysuria.  She has trace leukocytes on urinalysis, and bacteria, though no overt pyuria.  Clinically, she is otherwise well-appearing.  She has no evidence to suggest PTA or RPA.  She is satting well on room air with clear lung sounds and normal chest x-ray without evidence of pneumonia.  I have sent an outpatient COVID given current pandemic, though would treat this as an outpatient as well.  Her abdomen has some minimal suprapubic tenderness and given her symptoms as well as trace leukocytes, I do think it is pertinent to treat, though she has no evidence is just pyelonephritis or complicated UTI.  Augmentin should cover both UTI as well as possible sinusitis, so will give her a 10-day course for this.  Otherwise, she appears well.  Lab work is very reassuring.  ____________________________________________   FINAL CLINICAL IMPRESSION(S) / ED DIAGNOSES  Final diagnoses:  Upper respiratory tract infection, unspecified type  Pharyngitis, unspecified etiology     MEDICATIONS GIVEN DURING THIS VISIT:  Medications - No data to display   ED Discharge Orders         Ordered    amoxicillin-clavulanate (AUGMENTIN) 875-125 MG tablet  2 times daily  08/18/19 1817           Note:  This document was prepared using Dragon voice recognition software and may include unintentional dictation errors.   Duffy Bruce, MD 08/18/19 [place]

## 2019-08-18 NOTE — ED Notes (Signed)
ED Provider at bedside. 

## 2019-08-18 NOTE — Discharge Instructions (Addendum)
As we mentioned, we sent a test for care of it.  This should come back in the next several days.  Until then, quarantine at home.  Drink plenty fluids.  Take the antibiotic as prescribed.

## 2019-08-19 LAB — URINE CULTURE: Culture: NO GROWTH

## 2019-08-20 LAB — NOVEL CORONAVIRUS, NAA (HOSP ORDER, SEND-OUT TO REF LAB; TAT 18-24 HRS): SARS-CoV-2, NAA: NOT DETECTED

## 2019-10-19 ENCOUNTER — Other Ambulatory Visit: Payer: Self-pay

## 2019-10-19 DIAGNOSIS — Z20822 Contact with and (suspected) exposure to covid-19: Secondary | ICD-10-CM

## 2019-10-22 LAB — NOVEL CORONAVIRUS, NAA: SARS-CoV-2, NAA: NOT DETECTED

## 2019-10-27 ENCOUNTER — Other Ambulatory Visit: Payer: Self-pay

## 2019-10-27 ENCOUNTER — Emergency Department
Admission: EM | Admit: 2019-10-27 | Discharge: 2019-10-27 | Disposition: A | Discharge status: Home / Self Care | Payer: Self-pay | Attending: Emergency Medicine | Admitting: Emergency Medicine

## 2019-10-27 ENCOUNTER — Emergency Department: Payer: Self-pay

## 2019-10-27 ENCOUNTER — Encounter: Payer: Self-pay | Admitting: Intensive Care

## 2019-10-27 DIAGNOSIS — M546 Pain in thoracic spine: Secondary | ICD-10-CM

## 2019-10-27 DIAGNOSIS — U071 COVID-19: Secondary | ICD-10-CM | POA: Insufficient documentation

## 2019-10-27 DIAGNOSIS — M791 Myalgia, unspecified site: Secondary | ICD-10-CM

## 2019-10-27 DIAGNOSIS — Z20822 Contact with and (suspected) exposure to covid-19: Secondary | ICD-10-CM

## 2019-10-27 DIAGNOSIS — R519 Headache, unspecified: Secondary | ICD-10-CM

## 2019-10-27 DIAGNOSIS — R531 Weakness: Secondary | ICD-10-CM

## 2019-10-27 LAB — URINALYSIS, COMPLETE (UACMP) WITH MICROSCOPIC
Bacteria, UA: NONE SEEN
Bilirubin Urine: NEGATIVE
Glucose, UA: NEGATIVE mg/dL
Ketones, ur: NEGATIVE mg/dL
Nitrite: NEGATIVE
Protein, ur: NEGATIVE mg/dL
Specific Gravity, Urine: 1.012 (ref 1.005–1.030)
pH: 5 (ref 5.0–8.0)

## 2019-10-27 LAB — CBC
HCT: 41.8 % (ref 36.0–46.0)
Hemoglobin: 14.1 g/dL (ref 12.0–15.0)
MCH: 29.1 pg (ref 26.0–34.0)
MCHC: 33.7 g/dL (ref 30.0–36.0)
MCV: 86.4 fL (ref 80.0–100.0)
Platelets: 190 10*3/uL (ref 150–400)
RBC: 4.84 MIL/uL (ref 3.87–5.11)
RDW: 13.3 % (ref 11.5–15.5)
WBC: 4 10*3/uL (ref 4.0–10.5)
nRBC: 0 % (ref 0.0–0.2)

## 2019-10-27 LAB — BASIC METABOLIC PANEL
Anion gap: 10 (ref 5–15)
BUN: 12 mg/dL (ref 8–23)
CO2: 22 mmol/L (ref 22–32)
Calcium: 9.2 mg/dL (ref 8.9–10.3)
Chloride: 106 mmol/L (ref 98–111)
Creatinine, Ser: 0.93 mg/dL (ref 0.44–1.00)
GFR calc Af Amer: >60 mL/min (ref >60)
GFR calc non Af Amer: >60 mL/min (ref >60)
Glucose, Bld: 165 mg/dL — ABNORMAL HIGH (ref 70–99)
Potassium: 3.9 mmol/L (ref 3.5–5.1)
Sodium: 138 mmol/L (ref 135–145)

## 2019-10-27 NOTE — ED Provider Notes (Signed)
[facility] Emergency Department Provider Note   ____________________________________________   First MD Initiated Contact with Patient 10/27/19 1525     (approximate)  I have reviewed the triage vital signs and the nursing notes.   HISTORY  Chief Complaint Weakness, Headache, and Back Pain    HPI Jillian Thomas is a 68 y.o. female with no significant past medical history who presents to the ED complaining of headache, congestion, back pain, and generalized weakness.  Patient reports that she has had about 1 week of nasal and sinus congestion associated with malaise and diffuse body aches.  She states it primarily affects both sides of her upper back but she also feels generally weak and achy throughout her body.  She has not had any fevers and denies any chest pain, cough, or shortness of breath.  She was recently exposed to her granddaughter, who was diagnosed with COVID-19.  She underwent testing for COVID-19 last week, when she was asymptomatic, which was negative.  She denies any vision changes, numbness, or weakness.        History reviewed. No pertinent past medical history.  Patient Active Problem List   Diagnosis Date Noted  . Palpitations 03/14/2016  . Obesity 03/14/2016    Past Surgical History:  Procedure Laterality Date  . CHOLECYSTECTOMY    . TUBAL LIGATION      Prior to Admission medications   Medication Sig Start Date End Date Taking? Authorizing Provider  albuterol (PROVENTIL HFA;VENTOLIN HFA) 108 (90 Base) MCG/ACT inhaler Inhale 2 puffs into the lungs every 6 (six) hours as needed for wheezing or shortness of breath. 05/30/16   Schaevitz, Myra Rudeavid Matthew, MD  cephALEXin (KEFLEX) 500 MG capsule Take 1 capsule (500 mg total) by mouth 2 (two) times daily. 11/15/16   Jene EveryKinner, Robert, MD  chlorpheniramine-HYDROcodone [facility](TUSSIONEX PENNKINETIC ER) 10-8 MG/5ML SUER Take 5 mLs by mouth 2 (two) times daily. 06/17/16   Irean HongSung, Jade J, MD  lidocaine  (XYLOCAINE) 2 % solution Use as directed 5 mLs in the mouth or throat every 6 (six) hours as needed for mouth pain. Swish and slow swallow. 05/09/19   Joni ReiningSmith, Ronald K, PA-C  loratadine (CLARITIN) 10 MG tablet Take 1 tablet (10 mg total) by mouth daily. 04/18/16   Hagler, Jami L, PA-C  magic mouthwash SOLN Take 5 mLs by mouth 4 (four) times daily as needed for mouth pain. Please mix 60mL of each ingredient totaling 180mL 04/18/16   Hagler, Jami L, PA-C  nitroGLYCERIN (NITROSTAT) 0.4 MG SL tablet Place 1 tablet (0.4 mg total) under the tongue every 5 (five) minutes as needed for chest pain. 02/05/16   Almond LintIngal, Aileen, MD  ondansetron (ZOFRAN) 4 MG tablet Take 1 tablet (4 mg total) by mouth every 8 (eight) hours as needed. 06/01/16   Phineas SemenGoodman, Graydon, MD  oxyCODONE-acetaminophen (ROXICET) 5-325 MG tablet Take 1 tablet by mouth every 4 (four) hours as needed for severe pain. 10/22/16   Darci CurrentBrown, Worthington N, MD  propranolol (INDERAL) 10 MG tablet Take 1 tablet (10 mg total) by mouth 2 (two) times daily. Patient not taking: Reported on 02/05/2016 01/15/16 01/29/16  Jennye MoccasinQuigley, Brian S, MD    Allergies Patient has no known allergies.  History reviewed. No pertinent family history.  Social History Social History   Tobacco Use  . Smoking status: Never Smoker  . Smokeless tobacco: Never Used  Substance Use Topics  . Alcohol use: No  . Drug use: No    Review of Systems  Constitutional: No fever/chills.  Positive for malaise and generalized weakness. Eyes: No visual changes. ENT: No sore throat. Cardiovascular: Denies chest pain. Respiratory: Denies shortness of breath. Gastrointestinal: No abdominal pain.  No nausea, no vomiting.  No diarrhea.  No constipation. Genitourinary: Negative for dysuria. Musculoskeletal: Positive for back pain.  Positive for myalgias. Skin: Negative for rash. Neurological: Positive for headaches, negative for focal weakness or  numbness.  ____________________________________________   PHYSICAL EXAM:  VITAL SIGNS: ED Triage Vitals  Enc Vitals Group     BP 10/27/19 1329 125/85     Pulse Rate 10/27/19 1329 (!) 107     Resp 10/27/19 1329 20     Temp 10/27/19 1329 99.1 F (37.3 C)     Temp Source 10/27/19 1329 Oral     SpO2 10/27/19 1329 96 %     Weight 10/27/19 1349 198 lb (89.8 kg)     Height 10/27/19 1349 5\' 3"  (1.6 m)     Head Circumference --      Peak Flow --      Pain Score 10/27/19 1349 5     Pain Loc --      Pain Edu? --      Excl. in [place]? --     Constitutional: Alert and oriented. Eyes: Conjunctivae are normal. Head: Atraumatic. Nose: No congestion/rhinnorhea. Mouth/Throat: Mucous membranes are moist. Neck: Normal ROM Cardiovascular: Normal rate, regular rhythm. Grossly normal heart sounds. Respiratory: Normal respiratory effort.  No retractions. Lungs CTAB. Gastrointestinal: Soft and nontender. No distention. Genitourinary: deferred Musculoskeletal: No lower extremity tenderness nor edema.  Diffuse tenderness to palpation over thoracic paraspinal area bilaterally.  No midline cervical, thoracic, or lumbar spinal tenderness. Neurologic:  Normal speech and language. No gross focal neurologic deficits are appreciated. Skin:  Skin is warm, dry and intact. No rash noted. Psychiatric: Mood and affect are normal. Speech and behavior are normal.  ____________________________________________   LABS (all labs ordered are listed, but only abnormal results are displayed)  Labs Reviewed  BASIC METABOLIC PANEL - Abnormal; Notable for the following components:      Result Value   Glucose, Bld 165 (*)    All other components within normal limits  URINALYSIS, COMPLETE (UACMP) WITH MICROSCOPIC - Abnormal; Notable for the following components:   Color, Urine YELLOW (*)    APPearance CLOUDY (*)    Hgb urine dipstick SMALL (*)    Leukocytes,Ua MODERATE (*)    All other components within normal  limits  NOVEL CORONAVIRUS, NAA (HOSP ORDER, SEND-OUT TO REF LAB; TAT 18-24 HRS)  URINE CULTURE  CBC   ____________________________________________  EKG  ED ECG REPORT I, Blake Divine, the attending physician, personally viewed and interpreted this ECG.   Date: 10/27/2019  EKG Time: 13:32  Rate: 113  Rhythm: sinus tachycardia  Axis: LAD  Intervals:none  ST&T Change: None  PROCEDURES  Procedure(s) performed (including Critical Care):  Procedures   ____________________________________________   INITIAL IMPRESSION / ASSESSMENT AND PLAN / ED COURSE       68 year old female with no significant past medical history presents to the ED complaining of approximately 1 week of congestion, generalized weakness, headache, and bilateral upper back pain.  While she is slightly tachycardic, she appears overall well with no fever or leukocytosis, doubt sepsis.  EKG shows sinus tachycardia with no ischemic changes.  Lab work is thus far unremarkable, will screen chest x-ray and UA.  She would also benefit from repeat COVID-19 testing given her positive exposure and she was  counseled on quarantine practices.  Chest x-ray negative for acute process and UA without clear evidence of infection.  Patient was again counseled on quarantine procedures, also counseled to follow-up with her PCP.  Patient agrees to return to the ED for worsening symptoms, she is appropriate for discharge home.      ____________________________________________   FINAL CLINICAL IMPRESSION(S) / ED DIAGNOSES  Final diagnoses:  Suspected COVID-19 virus infection  Myalgia  Acute bilateral thoracic back pain  Acute nonintractable headache, unspecified headache type  Generalized weakness     ED Discharge Orders    None       Note:  This document was prepared using Dragon voice recognition software and may include unintentional dictation errors.   [name], MD 10/27/19 2033

## 2019-10-27 NOTE — ED Triage Notes (Signed)
C/o upper back pain, weakness, and sharp right sided headache. Reports this all started last Thursday. Patient drove herself to ER. Ambulatory with no problems.

## 2019-10-28 LAB — URINE CULTURE: Culture: NO GROWTH

## 2019-10-28 LAB — NOVEL CORONAVIRUS, NAA (HOSP ORDER, SEND-OUT TO REF LAB; TAT 18-24 HRS): SARS-CoV-2, NAA: DETECTED — AB

## 2019-10-29 ENCOUNTER — Telehealth: Payer: Self-pay | Admitting: Infectious Diseases

## 2019-10-29 NOTE — Telephone Encounter (Signed)
Patient reviewed for consideration of monoclonal antibody infusion for treatment of mild/moderate illness related to COVID-19.   Pt does not qualify for infusion therapy as her symptoms first presented > 10 days prior to timing of infusion.    Patient Active Problem List   Diagnosis Date Noted  . Palpitations 03/14/2016  . Obesity 03/14/2016     Janene Madeira, MSN, NP-C [facility] [email or web address] Pager: [phone] Office: [phone] [place]: [phone]

## 2019-10-30 ENCOUNTER — Telehealth: Payer: Self-pay

## 2019-10-30 NOTE — Telephone Encounter (Signed)
Pt given Covid-19 positive results. Discussed mild, moderate and severe symptoms. Advised pt to call 911 for any respiratory issues and/dehydration. Discussed non test criteria for ending self isolation. Pt advised of way to manage symptoms at home and review isolation precautions especially the importance of washing hands frequently and wearing a mask when around others. Pt verbalized understanding. Will report to HD. 

## 2019-10-31 ENCOUNTER — Telehealth: Payer: Self-pay | Admitting: *Deleted

## 2019-10-31 NOTE — Telephone Encounter (Signed)
Pt called to get advice of what to take over the counter for her cough. She tested positive for covid. Advised that she take what she would normally take for coughs. Advised her to drink a lot of fluids, hot tea with lemon and honey and take a couple of teaspoons of honey at bedtime. To also notify her provider that she is positive and what symptoms that she is having. She voiced understanding.

## 2019-11-02 ENCOUNTER — Emergency Department: Payer: Self-pay

## 2019-11-02 ENCOUNTER — Emergency Department
Admission: EM | Admit: 2019-11-02 | Discharge: 2019-11-02 | Disposition: A | Discharge status: Home / Self Care | Payer: Self-pay | Attending: Emergency Medicine | Admitting: Emergency Medicine

## 2019-11-02 ENCOUNTER — Other Ambulatory Visit: Payer: Self-pay

## 2019-11-02 ENCOUNTER — Encounter: Payer: Self-pay | Admitting: Emergency Medicine

## 2019-11-02 DIAGNOSIS — R1030 Lower abdominal pain, unspecified: Secondary | ICD-10-CM

## 2019-11-02 DIAGNOSIS — U071 COVID-19: Secondary | ICD-10-CM | POA: Insufficient documentation

## 2019-11-02 DIAGNOSIS — Z79899 Other long term (current) drug therapy: Secondary | ICD-10-CM | POA: Insufficient documentation

## 2019-11-02 DIAGNOSIS — N390 Urinary tract infection, site not specified: Secondary | ICD-10-CM | POA: Insufficient documentation

## 2019-11-02 LAB — COMPREHENSIVE METABOLIC PANEL
ALT: 18 U/L (ref 0–44)
AST: 21 U/L (ref 15–41)
Albumin: 3.9 g/dL (ref 3.5–5.0)
Alkaline Phosphatase: 59 U/L (ref 38–126)
Anion gap: 10 (ref 5–15)
BUN: 9 mg/dL (ref 8–23)
CO2: 24 mmol/L (ref 22–32)
Calcium: 9 mg/dL (ref 8.9–10.3)
Chloride: 104 mmol/L (ref 98–111)
Creatinine, Ser: 0.84 mg/dL (ref 0.44–1.00)
GFR calc Af Amer: >60 mL/min (ref >60)
GFR calc non Af Amer: >60 mL/min (ref >60)
Glucose, Bld: 137 mg/dL — ABNORMAL HIGH (ref 70–99)
Potassium: 3.7 mmol/L (ref 3.5–5.1)
Sodium: 138 mmol/L (ref 135–145)
Total Bilirubin: 0.9 mg/dL (ref 0.3–1.2)
Total Protein: 7.1 g/dL (ref 6.5–8.1)

## 2019-11-02 LAB — URINALYSIS, COMPLETE (UACMP) WITH MICROSCOPIC
Bilirubin Urine: NEGATIVE
Glucose, UA: NEGATIVE mg/dL
Ketones, ur: 5 mg/dL — AB
Nitrite: NEGATIVE
Protein, ur: NEGATIVE mg/dL
Specific Gravity, Urine: 1.012 (ref 1.005–1.030)
pH: 5 (ref 5.0–8.0)

## 2019-11-02 LAB — CBC
HCT: 40.6 % (ref 36.0–46.0)
Hemoglobin: 13.9 g/dL (ref 12.0–15.0)
MCH: 29.1 pg (ref 26.0–34.0)
MCHC: 34.2 g/dL (ref 30.0–36.0)
MCV: 84.9 fL (ref 80.0–100.0)
Platelets: 173 10*3/uL (ref 150–400)
RBC: 4.78 MIL/uL (ref 3.87–5.11)
RDW: 13.1 % (ref 11.5–15.5)
WBC: 4.1 10*3/uL (ref 4.0–10.5)
nRBC: 0 % (ref 0.0–0.2)

## 2019-11-02 LAB — LIPASE, BLOOD: Lipase: 25 U/L (ref 11–51)

## 2019-11-02 MED ORDER — HYDROCOD POLST-CPM POLST ER 10-8 MG/5ML PO SUER: 5.0000 mL | Freq: Two times a day (BID) | ORAL | 0 refills | Status: DC | PRN

## 2019-11-02 MED ORDER — IOHEXOL 300 MG/ML  SOLN
100.0000 mL | Freq: Once | INTRAMUSCULAR | Status: AC | PRN
  Administered 2019-11-02: 100 mL via INTRAVENOUS

## 2019-11-02 MED ORDER — CEPHALEXIN 500 MG PO CAPS: 500.0000 mg | ORAL_CAPSULE | Freq: Two times a day (BID) | ORAL | 0 refills | Status: DC

## 2019-11-02 MED ORDER — SODIUM CHLORIDE 0.9% FLUSH: 3.0000 mL | Freq: Once | INTRAVENOUS | Status: DC

## 2019-11-02 NOTE — ED Triage Notes (Signed)
Pt presents to ED via [facility] from home with c/o lower abdominal pain that radiates into R flank x several days. Per EMS pt c/o loose stools but denies diarrhea, possible blood. C/o dry heaves but denies vomiting. Per EMS pt tested Covid+ on 12/17.    99.7 137/83 90 94% RA

## 2019-11-02 NOTE — ED Provider Notes (Signed)
[facility] Emergency Department Provider Note   ____________________________________________    I have reviewed the triage vital signs and the nursing notes.   HISTORY  Chief Complaint Abdominal Pain     HPI Jillian Thomas is a 68 y.o. female who presents with abdominal pain.  Patient notes several days of suprapubic pain primarily with dysuria.  She reports it feels somewhat like a urinary tract infection.  She also has some pain in the right lower quadrant.  Was diagnosed with novel coronavirus on 12/17 but reports her breathing has been okay.  She does have extensive coughing.  No nausea or vomiting  History reviewed. No pertinent past medical history.  Patient Active Problem List   Diagnosis Date Noted  . Palpitations 03/14/2016  . Obesity 03/14/2016    Past Surgical History:  Procedure Laterality Date  . CHOLECYSTECTOMY    . TUBAL LIGATION      Prior to Admission medications   Medication Sig Start Date End Date Taking? Authorizing Provider  albuterol (PROVENTIL HFA;VENTOLIN HFA) 108 (90 Base) MCG/ACT inhaler Inhale 2 puffs into the lungs every 6 (six) hours as needed for wheezing or shortness of breath. 05/30/16   Schaevitz, Myra Rude, MD  cephALEXin (KEFLEX) 500 MG capsule Take 1 capsule (500 mg total) by mouth 2 (two) times daily. 11/02/19   Jene Every, MD  chlorpheniramine-HYDROcodone [facility]) 10-8 MG/5ML SUER Take 5 mLs by mouth every 12 (twelve) hours as needed for cough. 11/02/19   Jene Every, MD  lidocaine (XYLOCAINE) 2 % solution Use as directed 5 mLs in the mouth or throat every 6 (six) hours as needed for mouth pain. Swish and slow swallow. 05/09/19   Joni Reining, PA-C  loratadine (CLARITIN) 10 MG tablet Take 1 tablet (10 mg total) by mouth daily. 04/18/16   Hagler, Jami L, PA-C  magic mouthwash SOLN Take 5 mLs by mouth 4 (four) times daily as needed for mouth pain. Please mix 68mL of each ingredient  totaling 04/18/16   Hagler, Jami L, PA-C  nitroGLYCERIN (NITROSTAT) 0.4 MG SL tablet Place 1 tablet (0.4 mg total) under the tongue every 5 (five) minutes as needed for chest pain. 02/05/16   Almond Lint, MD  ondansetron (ZOFRAN) 4 MG tablet Take 1 tablet (4 mg total) by mouth every 8 (eight) hours as needed. 06/01/16   Phineas Semen, MD  oxyCODONE-acetaminophen (ROXICET) 5-325 MG tablet Take 1 tablet by mouth every 4 (four) hours as needed for severe pain. 10/22/16   Darci Current, MD  propranolol (INDERAL) 10 MG tablet Take 1 tablet (10 mg total) by mouth 2 (two) times daily. Patient not taking: Reported on 02/05/2016 01/15/16 01/29/16  Jennye Moccasin, MD     Allergies Patient has no known allergies.  History reviewed. No pertinent family history.  Social History Social History   Tobacco Use  . Smoking status: Never Smoker  . Smokeless tobacco: Never Used  Substance Use Topics  . Alcohol use: No  . Drug use: No    Review of Systems  Constitutional: No fever/chills Eyes: No visual changes.  ENT: No sore throat. Cardiovascular: Denies chest pain. Respiratory: As above Gastrointestinal: As above Genitourinary: As above Musculoskeletal: Negative for back pain. Skin: Negative for rash. Neurological: Negative for headaches    ____________________________________________   PHYSICAL EXAM:  VITAL SIGNS: ED Triage Vitals  Enc Vitals Group     BP 11/02/19 0711 136/79     Pulse Rate 11/02/19  0711 81     Resp 11/02/19 0711 (!) 22     Temp 11/02/19 0711 98.2 F (36.8 C)     Temp Source 11/02/19 0711 Oral     SpO2 11/02/19 0711 93 %     Weight 11/02/19 0708 89.8 kg (198 lb)     Height 11/02/19 0708 1.6 m (5\' 3" )     Head Circumference --      Peak Flow --      Pain Score 11/02/19 0710 8     Pain Loc --      Pain Edu? --      Excl. in [place]? --     Constitutional: Alert and oriented.   Nose: No congestion/rhinnorhea. Mouth/Throat: Mucous membranes are moist.     Cardiovascular: Normal rate, regular rhythm. Grossly normal heart sounds.  Good peripheral circulation. Respiratory: Mildly increased respiratory effort with tachypnea, bibasilar Rales Gastrointestinal: Soft, mild suprapubic tenderness palpation, no distention, minimal right CVA tenderness Genitourinary: deferred Musculoskeletal:  Warm and well perfused Neurologic:  Normal speech and language. No gross focal neurologic deficits are appreciated.  Skin:  Skin is warm, dry and intact. No rash noted. Psychiatric: Mood and affect are normal. Speech and behavior are normal.  ____________________________________________   LABS (all labs ordered are listed, but only abnormal results are displayed)  Labs Reviewed  COMPREHENSIVE METABOLIC PANEL - Abnormal; Notable for the following components:      Result Value   Glucose, Bld 137 (*)    All other components within normal limits  URINALYSIS, COMPLETE (UACMP) WITH MICROSCOPIC - Abnormal; Notable for the following components:   Color, Urine YELLOW (*)    APPearance HAZY (*)    Hgb urine dipstick SMALL (*)    Ketones, ur 5 (*)    Leukocytes,Ua MODERATE (*)    Bacteria, UA RARE (*)    All other components within normal limits  LIPASE, BLOOD  CBC   ____________________________________________  EKG  None ____________________________________________  RADIOLOGY CT abdomen pelvis ____________________________________________   PROCEDURES  Procedure(s) performed: No  Procedures   Critical Care performed: No ____________________________________________   INITIAL IMPRESSION / ASSESSMENT AND PLAN / ED COURSE  Pertinent labs & imaging results that were available during my care of the patient were reviewed by me and considered in my medical decision making (see chart for details).  Patient with known COVID-19 presents with suprapubic discomfort suspicious for urinary tract infection.  CBC is reassuring, white blood cell count is  normal.  Afebrile currently.  Mild tenderness.  Pending urinalysis.  Urinalysis positive leukocytes and bacteria, did obtain CT abdomen pelvis to evaluate further, no indication of acute abnormality on imaging.  Will treat with antibiotics.  Pain may be coronavirus related as well.  Outpatient follow-up as needed, return precautions discussed    ____________________________________________   FINAL CLINICAL IMPRESSION(S) / ED DIAGNOSES  Final diagnoses:  Lower urinary tract infectious disease  Lower abdominal pain  COVID-19        Note:  This document was prepared using Dragon voice recognition software and may include unintentional dictation errors.   Lavonia Drafts, MD 11/02/19 [phone]

## 2019-11-05 ENCOUNTER — Encounter: Payer: Self-pay | Admitting: Emergency Medicine

## 2019-11-05 ENCOUNTER — Other Ambulatory Visit: Payer: Self-pay

## 2019-11-05 DIAGNOSIS — Z79899 Other long term (current) drug therapy: Secondary | ICD-10-CM | POA: Insufficient documentation

## 2019-11-05 DIAGNOSIS — J1289 Other viral pneumonia: Secondary | ICD-10-CM | POA: Insufficient documentation

## 2019-11-05 DIAGNOSIS — U071 COVID-19: Secondary | ICD-10-CM | POA: Insufficient documentation

## 2019-11-05 NOTE — ED Triage Notes (Signed)
States diagnosed with COVID 12/17. States has been symptoms since 12/14. States began upper back pain last night.

## 2019-11-06 ENCOUNTER — Emergency Department: Payer: Self-pay

## 2019-11-06 ENCOUNTER — Encounter: Payer: Self-pay | Admitting: Radiology

## 2019-11-06 ENCOUNTER — Emergency Department
Admission: EM | Admit: 2019-11-06 | Discharge: 2019-11-06 | Disposition: A | Discharge status: Home / Self Care | Payer: Self-pay | Attending: Emergency Medicine | Admitting: Emergency Medicine

## 2019-11-06 DIAGNOSIS — R52 Pain, unspecified: Secondary | ICD-10-CM

## 2019-11-06 DIAGNOSIS — J1282 Pneumonia due to coronavirus disease 2019: Secondary | ICD-10-CM

## 2019-11-06 LAB — COMPREHENSIVE METABOLIC PANEL
ALT: 19 U/L (ref 0–44)
AST: 20 U/L (ref 15–41)
Albumin: 4 g/dL (ref 3.5–5.0)
Alkaline Phosphatase: 60 U/L (ref 38–126)
Anion gap: 11 (ref 5–15)
BUN: 9 mg/dL (ref 8–23)
CO2: 24 mmol/L (ref 22–32)
Calcium: 9.2 mg/dL (ref 8.9–10.3)
Chloride: 106 mmol/L (ref 98–111)
Creatinine, Ser: 0.73 mg/dL (ref 0.44–1.00)
GFR calc Af Amer: >60 mL/min (ref >60)
GFR calc non Af Amer: >60 mL/min (ref >60)
Glucose, Bld: 109 mg/dL — ABNORMAL HIGH (ref 70–99)
Potassium: 3.7 mmol/L (ref 3.5–5.1)
Sodium: 141 mmol/L (ref 135–145)
Total Bilirubin: 0.7 mg/dL (ref 0.3–1.2)
Total Protein: 7.1 g/dL (ref 6.5–8.1)

## 2019-11-06 LAB — PROCALCITONIN: Procalcitonin: <0.1 ng/mL

## 2019-11-06 LAB — CBC WITH DIFFERENTIAL/PLATELET
Abs Immature Granulocytes: 0.02 10*3/uL (ref 0.00–0.07)
Basophils Absolute: 0 10*3/uL (ref 0.0–0.1)
Basophils Relative: 1 %
Eosinophils Absolute: 0.1 10*3/uL (ref 0.0–0.5)
Eosinophils Relative: 2 %
HCT: 38.5 % (ref 36.0–46.0)
Hemoglobin: 13.6 g/dL (ref 12.0–15.0)
Immature Granulocytes: 0 %
Lymphocytes Relative: 36 %
Lymphs Abs: 2.1 10*3/uL (ref 0.7–4.0)
MCH: 28.9 pg (ref 26.0–34.0)
MCHC: 35.3 g/dL (ref 30.0–36.0)
MCV: 81.9 fL (ref 80.0–100.0)
Monocytes Absolute: 0.4 10*3/uL (ref 0.1–1.0)
Monocytes Relative: 7 %
Neutro Abs: 3.1 10*3/uL (ref 1.7–7.7)
Neutrophils Relative %: 54 %
Platelets: 239 10*3/uL (ref 150–400)
RBC: 4.7 MIL/uL (ref 3.87–5.11)
RDW: 13 % (ref 11.5–15.5)
WBC: 5.8 10*3/uL (ref 4.0–10.5)
nRBC: 0 % (ref 0.0–0.2)

## 2019-11-06 LAB — FIBRIN DERIVATIVES D-DIMER (ARMC ONLY): Fibrin derivatives D-dimer (ARMC): 1130.61 ng/mL (FEU) — ABNORMAL HIGH (ref 0.00–499.00)

## 2019-11-06 LAB — BRAIN NATRIURETIC PEPTIDE: B Natriuretic Peptide: 36 pg/mL (ref 0.0–100.0)

## 2019-11-06 LAB — TROPONIN I (HIGH SENSITIVITY)
Troponin I (High Sensitivity): 4 ng/L (ref <18)
Troponin I (High Sensitivity): 5 ng/L (ref <18)

## 2019-11-06 MED ORDER — IOHEXOL 350 MG/ML SOLN
75.0000 mL | Freq: Once | INTRAVENOUS | Status: AC | PRN
  Administered 2019-11-06: 75 mL via INTRAVENOUS

## 2019-11-06 NOTE — ED Notes (Signed)
Patient walked to restroom without assistance, says she feels "pretty good"

## 2019-11-06 NOTE — Discharge Instructions (Addendum)

## 2019-11-06 NOTE — ED Notes (Signed)
Patient assisted to the bathroom 

## 2019-11-06 NOTE — ED Notes (Signed)
Called lab, tests in progress

## 2019-11-06 NOTE — ED Provider Notes (Signed)
[facility] Emergency Department Provider Note  ____________________________________________  Time seen: Approximately 4:59 AM  I have reviewed the triage vital signs and the nursing notes.   HISTORY  Chief Complaint Back Pain and positive COVID   HPI Jillian Thomas is a 68 y.o. female who presents for evaluation of upper back pain.  Patient was tested positive for Covid 10 days ago.  She reports that initially she had a burning pain in her upper back but that resolved.  The pain started again last night.  The pain is moderate, burning, located in the her upper back between her shoulder blades, constant and nonradiating.  No shortness of breath, no pleuritic pain, no cough, no chest pain, no fevers, no vomiting or diarrhea, no body aches.  Patient denies having any other significant symptoms associated with Covid.  She reports that her husband is home sick with Covid.   She denies personal or family history of blood clots, recent travel immobilization, leg pain or swelling, or hemoptysis.  History reviewed. No pertinent past medical history.  Patient Active Problem List   Diagnosis Date Noted  . Palpitations 03/14/2016  . Obesity 03/14/2016    Past Surgical History:  Procedure Laterality Date  . CHOLECYSTECTOMY    . TUBAL LIGATION      Prior to Admission medications   Medication Sig Start Date End Date Taking? Authorizing Provider  albuterol (PROVENTIL HFA;VENTOLIN HFA) 108 (90 Base) MCG/ACT inhaler Inhale 2 puffs into the lungs every 6 (six) hours as needed for wheezing or shortness of breath. 05/30/16   Schaevitz, Myra Rude, MD  cephALEXin (KEFLEX) 500 MG capsule Take 1 capsule (500 mg total) by mouth 2 (two) times daily. 11/02/19   Jene Every, MD  chlorpheniramine-HYDROcodone [facility]) 10-8 MG/5ML SUER Take 5 mLs by mouth every 12 (twelve) hours as needed for cough. 11/02/19   Jene Every, MD  lidocaine (XYLOCAINE) 2 %  solution Use as directed 5 mLs in the mouth or throat every 6 (six) hours as needed for mouth pain. Swish and slow swallow. 05/09/19   Joni Reining, PA-C  loratadine (CLARITIN) 10 MG tablet Take 1 tablet (10 mg total) by mouth daily. 04/18/16   Hagler, Jami L, PA-C  magic mouthwash SOLN Take 5 mLs by mouth 4 (four) times daily as needed for mouth pain. Please mix 7mL of each ingredient totaling 04/18/16   Hagler, Jami L, PA-C  nitroGLYCERIN (NITROSTAT) 0.4 MG SL tablet Place 1 tablet (0.4 mg total) under the tongue every 5 (five) minutes as needed for chest pain. 02/05/16   Almond Lint, MD  ondansetron (ZOFRAN) 4 MG tablet Take 1 tablet (4 mg total) by mouth every 8 (eight) hours as needed. 06/01/16   Phineas Semen, MD  oxyCODONE-acetaminophen (ROXICET) 5-325 MG tablet Take 1 tablet by mouth every 4 (four) hours as needed for severe pain. 10/22/16   Darci Current, MD  propranolol (INDERAL) 10 MG tablet Take 1 tablet (10 mg total) by mouth 2 (two) times daily. Patient not taking: Reported on 02/05/2016 01/15/16 01/29/16  Jennye Moccasin, MD    Allergies Patient has no known allergies.  No family history on file.  Social History Social History   Tobacco Use  . Smoking status: Never Smoker  . Smokeless tobacco: Never Used  Substance Use Topics  . Alcohol use: No  . Drug use: No    Review of Systems  Constitutional: Negative for fever. Eyes: Negative for visual changes.  ENT: Negative for sore throat. Neck: No neck pain  Cardiovascular: Negative for chest pain. Respiratory: Negative for shortness of breath. Gastrointestinal: Negative for abdominal pain, vomiting or diarrhea. Genitourinary: Negative for dysuria. Musculoskeletal: + upper back pain. Skin: Negative for rash. Neurological: Negative for headaches, weakness or numbness. Psych: No SI or HI  ____________________________________________   PHYSICAL EXAM:  VITAL SIGNS: ED Triage Vitals [11/05/19 2309]  Enc  Vitals Group     BP 125/74     Pulse Rate 85     Resp 20     Temp 98.2 F (36.8 C)     Temp Source Oral     SpO2 96 %     Weight 197 lb (89.4 kg)     Height 5\' 3"  (1.6 m)     Head Circumference      Peak Flow      Pain Score 6     Pain Loc      Pain Edu?      Excl. in [place]?     Constitutional: Alert and oriented. Well appearing and in no apparent distress. HEENT:      Head: Normocephalic and atraumatic.         Eyes: Conjunctivae are normal. Sclera is non-icteric.       Mouth/Throat: Mucous membranes are moist.       Neck: Supple with no signs of meningismus. Cardiovascular: Regular rate and rhythm. No murmurs, gallops, or rubs. 2+ symmetrical distal pulses are present in all extremities. No JVD. Respiratory: Normal respiratory effort. Lungs are clear to auscultation bilaterally. No wheezes, crackles, or rhonchi.  Gastrointestinal: Soft, non tender, and non distended with positive bowel sounds. No rebound or guarding. Musculoskeletal: Nontender with normal range of motion in all extremities. No edema, cyanosis, or erythema of extremities. Neurologic: Normal speech and language. Face is symmetric. Moving all extremities. No gross focal neurologic deficits are appreciated. Skin: Skin is warm, dry and intact. No rash noted. Psychiatric: Mood and affect are normal. Speech and behavior are normal.  ____________________________________________   LABS (all labs ordered are listed, but only abnormal results are displayed)  Labs Reviewed  COMPREHENSIVE METABOLIC PANEL - Abnormal; Notable for the following components:      Result Value   Glucose, Bld 109 (*)    All other components within normal limits  FIBRIN DERIVATIVES D-DIMER ([facility] ONLY) - Abnormal; Notable for the following components:   Fibrin derivatives D-dimer ([facility]) 1,130.61 (*)    All other components within normal limits  CBC WITH DIFFERENTIAL/PLATELET  PROCALCITONIN  BRAIN NATRIURETIC PEPTIDE  TROPONIN I (HIGH  SENSITIVITY)  TROPONIN I (HIGH SENSITIVITY)   ____________________________________________  EKG  ED ECG REPORT I, Rudene Re, the attending physician, personally viewed and interpreted this ECG.  Normal sinus rhythm, rate of 77, normal intervals, normal axis, low voltage QRS, no ST elevations or depressions.  Unchanged from prior. ____________________________________________  RADIOLOGY  I have personally reviewed the images performed during this visit and I agree with the Radiologist's read.   Interpretation by Radiologist:  DG Chest 1 View  Result Date: 11/06/2019 CLINICAL DATA:  COVID positive EXAM: CHEST  1 VIEW COMPARISON:  October 27, 2019 FINDINGS: The heart size and mediastinal contours are within normal limits. Mildly increased hazy airspace opacity seen within the left mid and lower lungs. Aortic knob calcifications. The visualized skeletal structures are unremarkable. IMPRESSION: Mildly increased hazy airspace opacity in the left mid and lower lungs which could be due to atelectasis and/or infectious etiology. Electronically  Signed   By: Jonna ClarkBindu  Avutu M.D.   On: 11/06/2019 02:13   CT Angio Chest PE W and/or Wo Contrast  Result Date: 11/06/2019 CLINICAL DATA:  COVID-19 positive 10/27/2019. Upper back pain beginning last night. Possible pulmonary embolism. EXAM: CT ANGIOGRAPHY CHEST WITH CONTRAST TECHNIQUE: Multidetector CT imaging of the chest was performed using the standard protocol during bolus administration of intravenous contrast. Multiplanar CT image reconstructions and MIPs were obtained to evaluate the vascular anatomy. CONTRAST:  75mL OMNIPAQUE IOHEXOL 350 MG/ML SOLN. Two separate injections were made as patient was unable to suspend respiration for the exam. COMPARISON:  None. FINDINGS: Cardiovascular: Heart is normal size. Calcified plaque present over the left main and left anterior descending coronary artery. Thoracic aorta is normal in caliber without  aneurysm or dissection. Pulmonary arterial system is adequately opacified without evidence of emboli. Remaining vascular structures are unremarkable. Mediastinum/Nodes: No mediastinal or hilar adenopathy. 9 mm left hilar lymph node likely reactive. Possible small sliding hiatal hernia. Remaining mediastinal structures are unremarkable. Lungs/Pleura: Lungs are adequately inflated and demonstrate patchy bilateral airspace process left worse than right likely multifocal pneumonia. No effusion. Airways are unremarkable. Upper Abdomen: No acute findings. Musculoskeletal: Mild degenerative change of the spine. Review of the MIP images confirms the above findings. IMPRESSION: 1.  No evidence of pulmonary embolus. 2. Patchy bilateral airspace process left worse than right likely multifocal pneumonia. 3.  Atherosclerotic coronary artery disease. 4.  Possible small sliding hiatal hernia. Electronically Signed   By: Elberta Fortisaniel  Boyle M.D.   On: 11/06/2019 05:39     ____________________________________________   PROCEDURES  Procedure(s) performed: None Procedures Critical Care performed:  None ____________________________________________   INITIAL IMPRESSION / ASSESSMENT AND PLAN / ED COURSE   68 y.o. female who presents for evaluation of upper back pain in the setting of positive Covid testing 10 days ago.  Patient is well-appearing in no distress with normal vitals, normal work of breathing, normal sats both at rest and with ambulation.  EKG with no evidence of dysrhythmias or ischemia.  Troponin x 2 negative . Chest x-ray concerning with Covid pneumonia.  Procalcitonin is negative therefore low suspicion of superinfection with bacterial pneumonia.  D-dimer is elevated therefore we will send patient for CT to rule out PE from Covid.    _________________________ 6:31 AM on 11/06/2019 -----------------------------------------  CT showing Covid pneumonia with no evidence of pulmonary embolism.  Patient remains  well-appearing with normal work of breathing and normal sats both at rest and with ambulation.  Recommended close monitoring of oxygen saturation with pulse oximeter at home.  Discussed quarantine and follow-up with PCP.  Discussed my standard return precautions.    As part of my medical decision making, I reviewed the following data within the electronic MEDICAL RECORD NUMBER Nursing notes reviewed and incorporated, Labs reviewed , EKG interpreted , Old EKG reviewed, Old chart reviewed, Radiograph reviewed , Notes from prior ED visits and [place] Controlled Substance Database   Please note:  Patient was evaluated in Emergency Department today for the symptoms described in the history of present illness. Patient was evaluated in the context of the global COVID-19 pandemic, which necessitated consideration that the patient might be at risk for infection with the SARS-CoV-2 virus that causes COVID-19. Institutional protocols and algorithms that pertain to the evaluation of patients at risk for COVID-19 are in a state of rapid change based on information released by regulatory bodies including the CDC and federal and state organizations. These policies and algorithms were  followed during the patient's care in the ED.  Some ED evaluations and interventions may be delayed as a result of limited staffing during the pandemic.   ____________________________________________   FINAL CLINICAL IMPRESSION(S) / ED DIAGNOSES   Final diagnoses:  Pneumonia due to COVID-19 virus      NEW MEDICATIONS STARTED DURING THIS VISIT:  ED Discharge Orders    None       Note:  This document was prepared using Dragon voice recognition software and may include unintentional dictation errors.    Nita Sickle, MD 11/06/19 [phone]

## 2019-11-06 NOTE — ED Notes (Signed)
ED Provider at bedside. 

## 2019-12-02 ENCOUNTER — Other Ambulatory Visit: Payer: Self-pay

## 2019-12-02 ENCOUNTER — Ambulatory Visit
Admission: RE | Admit: 2019-12-02 | Discharge: 2019-12-02 | Disposition: A | Discharge status: Home / Self Care | Payer: Self-pay | Source: Ambulatory Visit | Attending: Family Medicine | Admitting: Family Medicine

## 2019-12-02 ENCOUNTER — Other Ambulatory Visit: Payer: Self-pay | Admitting: Family Medicine

## 2019-12-02 DIAGNOSIS — J189 Pneumonia, unspecified organism: Secondary | ICD-10-CM | POA: Insufficient documentation

## 2020-01-10 ENCOUNTER — Emergency Department
Admission: EM | Admit: 2020-01-10 | Discharge: 2020-01-11 | Disposition: A | Discharge status: Home / Self Care | Payer: Self-pay | Attending: Emergency Medicine | Admitting: Emergency Medicine

## 2020-01-10 ENCOUNTER — Encounter: Payer: Self-pay | Admitting: *Deleted

## 2020-01-10 ENCOUNTER — Other Ambulatory Visit: Payer: Self-pay

## 2020-01-10 DIAGNOSIS — M5416 Radiculopathy, lumbar region: Secondary | ICD-10-CM

## 2020-01-10 DIAGNOSIS — N3000 Acute cystitis without hematuria: Secondary | ICD-10-CM

## 2020-01-10 DIAGNOSIS — Z9049 Acquired absence of other specified parts of digestive tract: Secondary | ICD-10-CM | POA: Insufficient documentation

## 2020-01-10 DIAGNOSIS — Z8616 Personal history of COVID-19: Secondary | ICD-10-CM | POA: Insufficient documentation

## 2020-01-10 DIAGNOSIS — Z79899 Other long term (current) drug therapy: Secondary | ICD-10-CM | POA: Insufficient documentation

## 2020-01-10 HISTORY — DX: COVID-19: U07.1

## 2020-01-10 LAB — URINALYSIS, COMPLETE (UACMP) WITH MICROSCOPIC
Bacteria, UA: NONE SEEN
Bilirubin Urine: NEGATIVE
Glucose, UA: NEGATIVE mg/dL
Ketones, ur: NEGATIVE mg/dL
Nitrite: NEGATIVE
Protein, ur: NEGATIVE mg/dL
Specific Gravity, Urine: 1.015 (ref 1.005–1.030)
WBC, UA: >50 WBC/hpf — ABNORMAL HIGH (ref 0–5)
pH: 7 (ref 5.0–8.0)

## 2020-01-10 NOTE — ED Triage Notes (Signed)
Pt ambulatory to triage. Pt reports back pain for 1 week.  Lower and upper back pain.  No known injury.  Hx covid.  Pt alert  Speech clear.

## 2020-01-11 ENCOUNTER — Emergency Department: Payer: Self-pay

## 2020-01-11 LAB — URINE CULTURE

## 2020-01-11 MED ORDER — CEPHALEXIN 500 MG PO CAPS: 500.0000 mg | ORAL_CAPSULE | Freq: Two times a day (BID) | ORAL | 0 refills | Status: AC

## 2020-01-11 MED ORDER — HYDROCODONE-ACETAMINOPHEN 5-325 MG PO TABS: 1.0000 | ORAL_TABLET | Freq: Once | ORAL | Status: DC

## 2020-01-11 MED ORDER — KETOROLAC TROMETHAMINE 10 MG PO TABS
10.0000 mg | ORAL_TABLET | Freq: Once | ORAL | Status: AC
  Administered 2020-01-11: 10 mg via ORAL
  Filled 2020-01-11: qty 1

## 2020-01-11 MED ORDER — HYDROCODONE-ACETAMINOPHEN 5-325 MG PO TABS: 1.0000 | ORAL_TABLET | ORAL | 0 refills | Status: DC | PRN

## 2020-01-11 NOTE — ED Notes (Signed)
Lab notified to add on urine culture to urine.

## 2020-01-11 NOTE — ED Notes (Signed)
Pt transported to XR.  

## 2020-01-11 NOTE — ED Provider Notes (Signed)
[facility] Emergency Department Provider Note  ____________________________________________   First MD Initiated Contact with Patient 01/11/20 [phone]     (approximate)  I have reviewed the triage vital signs and the nursing notes.   HISTORY  Chief Complaint Back Pain    HPI Jillian Thomas is a 69 y.o. female with below list of previous medical conditions including COVID-19 infection in December 2020 returns to the emergency department secondary to "burning upper back pain".  Patient also admits to low back pain that is worse with bending over.  Patient states that the low back pain radiates into her right lower leg.  Patient states that current pain score 7 out of 10.  Patient denies any dyspnea no cough.  Patient denies any fever.  Patient states that the burning upper back pain that she is currently experienced is consistent with when she had pneumonia associated with Covid.        Past Medical History:  Diagnosis Date   COVID-19     Patient Active Problem List   Diagnosis Date Noted   Palpitations 03/14/2016   Obesity 03/14/2016    Past Surgical History:  Procedure Laterality Date   CHOLECYSTECTOMY     TUBAL LIGATION      Prior to Admission medications   Medication Sig Start Date End Date Taking? Authorizing Provider  albuterol (PROVENTIL HFA;VENTOLIN HFA) 108 (90 Base) MCG/ACT inhaler Inhale 2 puffs into the lungs every 6 (six) hours as needed for wheezing or shortness of breath. 05/30/16   Schaevitz, Myra Rude, MD  cephALEXin (KEFLEX) 500 MG capsule Take 1 capsule (500 mg total) by mouth 2 (two) times daily. 11/02/19   Jene Every, MD  cephALEXin (KEFLEX) 500 MG capsule Take 1 capsule (500 mg total) by mouth 2 (two) times daily for 7 days. 01/11/20 01/18/20  Darci Current, MD  chlorpheniramine-HYDROcodone [facility] PENNKINETIC ER) 10-8 MG/5ML SUER Take 5 mLs by mouth every 12 (twelve) hours as needed for cough. 11/02/19    Jene Every, MD  HYDROcodone-acetaminophen (NORCO/VICODIN) 5-325 MG tablet Take 1 tablet by mouth every 4 (four) hours as needed for moderate pain. 01/11/20 01/10/21  Darci Current, MD  lidocaine (XYLOCAINE) 2 % solution Use as directed 5 mLs in the mouth or throat every 6 (six) hours as needed for mouth pain. Swish and slow swallow. 05/09/19   Joni Reining, PA-C  loratadine (CLARITIN) 10 MG tablet Take 1 tablet (10 mg total) by mouth daily. 04/18/16   Hagler, Jami L, PA-C  magic mouthwash SOLN Take 5 mLs by mouth 4 (four) times daily as needed for mouth pain. Please mix 36mL of each ingredient totaling 04/18/16   Hagler, Jami L, PA-C  nitroGLYCERIN (NITROSTAT) 0.4 MG SL tablet Place 1 tablet (0.4 mg total) under the tongue every 5 (five) minutes as needed for chest pain. 02/05/16   Almond Lint, MD  ondansetron (ZOFRAN) 4 MG tablet Take 1 tablet (4 mg total) by mouth every 8 (eight) hours as needed. 06/01/16   Phineas Semen, MD  oxyCODONE-acetaminophen (ROXICET) 5-325 MG tablet Take 1 tablet by mouth every 4 (four) hours as needed for severe pain. 10/22/16   Darci Current, MD  propranolol (INDERAL) 10 MG tablet Take 1 tablet (10 mg total) by mouth 2 (two) times daily. Patient not taking: Reported on 02/05/2016 01/15/16 01/29/16  Jennye Moccasin, MD    Allergies Patient has no known allergies.  No family history on file.  Social History Social  History   Tobacco Use   Smoking status: Never Smoker   Smokeless tobacco: Never Used  Substance Use Topics   Alcohol use: No   Drug use: No    Review of Systems Constitutional: No fever/chills Eyes: No visual changes. ENT: No sore throat. Cardiovascular: Denies chest pain. Respiratory: Denies shortness of breath. Gastrointestinal: No abdominal pain.  No nausea, no vomiting.  No diarrhea.  No constipation. Genitourinary: Negative for dysuria. Musculoskeletal: Negative for neck pain.  Positive for back pain. Integumentary:  Negative for rash. Neurological: Negative for headaches, focal weakness or numbness.   ____________________________________________   PHYSICAL EXAM:  VITAL SIGNS: ED Triage Vitals  Enc Vitals Group     BP 01/10/20 2317 (!) 183/107     Pulse Rate 01/10/20 2317 (!) 101     Resp 01/10/20 2317 20     Temp 01/10/20 2317 98.3 F (36.8 C)     Temp Source 01/10/20 2317 Oral     SpO2 01/10/20 2317 98 %     Weight 01/10/20 2318 88 kg (194 lb)     Height 01/10/20 2318 1.6 m (5\' 3" )     Head Circumference --      Peak Flow --      Pain Score 01/10/20 2318 7     Pain Loc --      Pain Edu? --      Excl. in [place]? --     Constitutional: Alert and oriented.  Eyes: Conjunctivae are normal.  Mouth/Throat: Patient is wearing a mask. Neck: No stridor.  No meningeal signs.   Cardiovascular: Normal rate, regular rhythm. Good peripheral circulation. Grossly normal heart sounds. Respiratory: Normal respiratory effort.  No retractions. Gastrointestinal: Soft and nontender. No distention.  Musculoskeletal: No lower extremity tenderness nor edema. No gross deformities of extremities. Neurologic:  Normal speech and language. No gross focal neurologic deficits are appreciated.  Skin:  Skin is warm, dry and intact. Psychiatric: Mood and affect are normal. Speech and behavior are normal.  ____________________________________________   LABS (all labs ordered are listed, but only abnormal results are displayed)  Labs Reviewed  URINALYSIS, COMPLETE (UACMP) WITH MICROSCOPIC - Abnormal; Notable for the following components:      Result Value   Color, Urine YELLOW (*)    APPearance HAZY (*)    Hgb urine dipstick SMALL (*)    Leukocytes,Ua LARGE (*)    WBC, UA >50 (*)    All other components within normal limits  URINE CULTURE   _________ RADIOLOGY I, [place] N Arlando Leisinger, personally viewed and evaluated these images (plain radiographs) as part of my medical decision making, as well as reviewing the  written report by the radiologist.  ED MD interpretation: Thoracic and lumbar radiculopathy noted on x-ray.  Chest x-ray revealed no acute findings.  Official radiology report(s): DG Chest 2 View  Result Date: 01/11/2020 CLINICAL DATA:  Back pain and dyspnea EXAM: CHEST - 2 VIEW COMPARISON:  December 02, 2019 FINDINGS: The heart size and mediastinal contours are within normal limits. Aortic knob calcifications are seen. Both lungs are clear. The visualized skeletal structures are unremarkable. Surgical clips in the right upper quadrant. IMPRESSION: No active cardiopulmonary disease. Electronically Signed   By: Prudencio Pair M.D.   On: 01/11/2020 01:33   DG Thoracic Spine 2 View  Result Date: 01/11/2020 CLINICAL DATA:  Back pain and dyspnea EXAM: THORACIC SPINE 2 VIEWS COMPARISON:  None. FINDINGS: There is no evidence of thoracic spine fracture. Alignment is normal. No other  significant bone abnormalities are identified. Mild disc height loss with small anterior osteophytes seen in the midthoracic spine. IMPRESSION: No acute fracture or malalignment. Electronically Signed   By: Jonna Clark M.D.   On: 01/11/2020 01:34   DG Lumbar Spine Complete  Result Date: 01/11/2020 CLINICAL DATA:  Back pain and dyspnea EXAM: LUMBAR SPINE - COMPLETE 4+ VIEW COMPARISON:  None. FINDINGS: There is no evidence of lumbar spine fracture. Alignment is normal. Mild disc height loss with facet arthropathy seen in the lower lumbar spine most notable at L4-L5 and L5-S1. Mild scattered aortic atherosclerosis. Surgical clips in the right upper quadrant. IMPRESSION: No acute fracture or malalignment. Electronically Signed   By: Jonna Clark M.D.   On: 01/11/2020 01:36    _______ Procedures   ____________________________________________   INITIAL IMPRESSION / MDM / ASSESSMENT AND PLAN / ED COURSE  As part of my medical decision making, I reviewed the following data within the electronic MEDICAL RECORD NUMBER  69 year old female  presented with above-stated history and physical exam concerning for possible spinal radiculopathy as well as urinary tract infection possible pyelonephritis.  Urinalysis consistent with UTI x-ray revealed lumbar and thoracic radiculopathy.  Patient given Keflex in the emergency department will be prescribed the same for home.  Patient also prescribed Norco and advised not to drive while taking this medication secondary to the fact that it may induce somnolence.      ____________________________________________  FINAL CLINICAL IMPRESSION(S) / ED DIAGNOSES  Final diagnoses:  Lumbar radiculopathy  Acute cystitis without hematuria     MEDICATIONS GIVEN DURING THIS VISIT:  Medications  ketorolac (TORADOL) tablet 10 mg (10 mg Oral Given 01/11/20 0212)     ED Discharge Orders         Ordered    HYDROcodone-acetaminophen (NORCO/VICODIN) 5-325 MG tablet  Every 4 hours PRN     01/11/20 0213    cephALEXin (KEFLEX) 500 MG capsule  2 times daily     01/11/20 6213          *Please note:  Jillian Thomas was evaluated in Emergency Department on 01/11/2020 for the symptoms described in the history of present illness. She was evaluated in the context of the global COVID-19 pandemic, which necessitated consideration that the patient might be at risk for infection with the SARS-CoV-2 virus that causes COVID-19. Institutional protocols and algorithms that pertain to the evaluation of patients at risk for COVID-19 are in a state of rapid change based on information released by regulatory bodies including the CDC and federal and state organizations. These policies and algorithms were followed during the patient's care in the ED.  Some ED evaluations and interventions may be delayed as a result of limited staffing during the pandemic.*  Note:  This document was prepared using Dragon voice recognition software and may include unintentional dictation errors.   Darci Current, MD 01/11/20 [phone]

## 2020-01-11 NOTE — ED Notes (Signed)
EDP at bedside  

## 2020-01-11 NOTE — ED Notes (Signed)
Pt c/o back pain in lower back, pt also c/o burning in upper back as well. Pt c/o lower abd pain as well

## 2020-01-11 NOTE — ED Notes (Signed)
Pt states she drove herself to ED, informed Dr. Manson Passey, will go in and talk with patient re: XR results.

## 2020-03-10 ENCOUNTER — Other Ambulatory Visit: Payer: Self-pay

## 2020-03-10 DIAGNOSIS — R0981 Nasal congestion: Secondary | ICD-10-CM | POA: Insufficient documentation

## 2020-03-10 DIAGNOSIS — M546 Pain in thoracic spine: Secondary | ICD-10-CM | POA: Insufficient documentation

## 2020-03-10 DIAGNOSIS — Z8616 Personal history of COVID-19: Secondary | ICD-10-CM | POA: Insufficient documentation

## 2020-03-10 DIAGNOSIS — R110 Nausea: Secondary | ICD-10-CM | POA: Insufficient documentation

## 2020-03-10 DIAGNOSIS — Z20822 Contact with and (suspected) exposure to covid-19: Secondary | ICD-10-CM | POA: Insufficient documentation

## 2020-03-10 LAB — CBC
HCT: 39.8 % (ref 36.0–46.0)
Hemoglobin: 13.9 g/dL (ref 12.0–15.0)
MCH: 30.3 pg (ref 26.0–34.0)
MCHC: 34.9 g/dL (ref 30.0–36.0)
MCV: 86.7 fL (ref 80.0–100.0)
Platelets: 227 10*3/uL (ref 150–400)
RBC: 4.59 MIL/uL (ref 3.87–5.11)
RDW: 13 % (ref 11.5–15.5)
WBC: 8.2 10*3/uL (ref 4.0–10.5)
nRBC: 0 % (ref 0.0–0.2)

## 2020-03-10 LAB — URINALYSIS, COMPLETE (UACMP) WITH MICROSCOPIC
Bilirubin Urine: NEGATIVE
Glucose, UA: NEGATIVE mg/dL
Ketones, ur: NEGATIVE mg/dL
Nitrite: NEGATIVE
Protein, ur: NEGATIVE mg/dL
Specific Gravity, Urine: 1.006 (ref 1.005–1.030)
pH: 6 (ref 5.0–8.0)

## 2020-03-10 LAB — COMPREHENSIVE METABOLIC PANEL
ALT: 16 U/L (ref 0–44)
AST: 17 U/L (ref 15–41)
Albumin: 4.3 g/dL (ref 3.5–5.0)
Alkaline Phosphatase: 58 U/L (ref 38–126)
Anion gap: 8 (ref 5–15)
BUN: 10 mg/dL (ref 8–23)
CO2: 24 mmol/L (ref 22–32)
Calcium: 9.4 mg/dL (ref 8.9–10.3)
Chloride: 107 mmol/L (ref 98–111)
Creatinine, Ser: 0.81 mg/dL (ref 0.44–1.00)
GFR calc Af Amer: >60 mL/min (ref >60)
GFR calc non Af Amer: >60 mL/min (ref >60)
Glucose, Bld: 111 mg/dL — ABNORMAL HIGH (ref 70–99)
Potassium: 3.6 mmol/L (ref 3.5–5.1)
Sodium: 139 mmol/L (ref 135–145)
Total Bilirubin: 0.8 mg/dL (ref 0.3–1.2)
Total Protein: 7.3 g/dL (ref 6.5–8.1)

## 2020-03-10 LAB — LIPASE, BLOOD: Lipase: 30 U/L (ref 11–51)

## 2020-03-10 NOTE — ED Notes (Signed)
At end of triage patient also reports having abdominal pain.

## 2020-03-10 NOTE — ED Triage Notes (Signed)
Patient reports symptoms for the past 3 days with congestion, sore throat and upper back pain.  Reports felt the same way with previous COVID diagnosis.

## 2020-03-11 ENCOUNTER — Emergency Department
Admission: EM | Admit: 2020-03-11 | Discharge: 2020-03-11 | Disposition: A | Discharge status: Home / Self Care | Payer: Self-pay | Attending: Emergency Medicine | Admitting: Emergency Medicine

## 2020-03-11 DIAGNOSIS — M546 Pain in thoracic spine: Secondary | ICD-10-CM

## 2020-03-11 LAB — RESPIRATORY PANEL BY RT PCR (FLU A&B, COVID)
Influenza A by PCR: NEGATIVE
Influenza B by PCR: NEGATIVE
SARS Coronavirus 2 by RT PCR: NEGATIVE

## 2020-03-11 NOTE — ED Provider Notes (Signed)
[facility] Emergency Department Provider Note   ____________________________________________   I have reviewed the triage vital signs and the nursing notes.   HISTORY  Chief Complaint Cough and Nasal Congestion   History limited by: Not Limited   HPI Jillian Thomas is a 69 y.o. female who presents to the emergency department today because of concerns for possible recurrent Covid.  The patient states over the past couple days she has noticed congestion as well as burning to the top of her back.  Additionally she has had some abdominal discomfort and nausea.  She states these are the same symptoms she had when she was diagnosed with Covid late last year.  She has not had any measured fevers. The patient denies any known recent sick contacts.  Records reviewed. Per medical record review patient has a history of COVID diagnosed 5 months ago  Past Medical History:  Diagnosis Date  . COVID-19     Patient Active Problem List   Diagnosis Date Noted  . Palpitations 03/14/2016  . Obesity 03/14/2016    Past Surgical History:  Procedure Laterality Date  . CHOLECYSTECTOMY    . TUBAL LIGATION      Prior to Admission medications   Medication Sig Start Date End Date Taking? Authorizing Provider  albuterol (PROVENTIL HFA;VENTOLIN HFA) 108 (90 Base) MCG/ACT inhaler Inhale 2 puffs into the lungs every 6 (six) hours as needed for wheezing or shortness of breath. 05/30/16   Schaevitz, Myra Rude, MD  cephALEXin (KEFLEX) 500 MG capsule Take 1 capsule (500 mg total) by mouth 2 (two) times daily. 11/02/19   Jene Every, MD  chlorpheniramine-HYDROcodone [facility] ER) 10-8 MG/5ML SUER Take 5 mLs by mouth every 12 (twelve) hours as needed for cough. 11/02/19   Jene Every, MD  HYDROcodone-acetaminophen (NORCO/VICODIN) 5-325 MG tablet Take 1 tablet by mouth every 4 (four) hours as needed for moderate pain. 01/11/20 01/10/21  Darci Current, MD  lidocaine  (XYLOCAINE) 2 % solution Use as directed 5 mLs in the mouth or throat every 6 (six) hours as needed for mouth pain. Swish and slow swallow. 05/09/19   Joni Reining, PA-C  loratadine (CLARITIN) 10 MG tablet Take 1 tablet (10 mg total) by mouth daily. 04/18/16   Hagler, Jami L, PA-C  magic mouthwash SOLN Take 5 mLs by mouth 4 (four) times daily as needed for mouth pain. Please mix 67mL of each ingredient totaling 04/18/16   Hagler, Jami L, PA-C  nitroGLYCERIN (NITROSTAT) 0.4 MG SL tablet Place 1 tablet (0.4 mg total) under the tongue every 5 (five) minutes as needed for chest pain. 02/05/16   Almond Lint, MD  ondansetron (ZOFRAN) 4 MG tablet Take 1 tablet (4 mg total) by mouth every 8 (eight) hours as needed. 06/01/16   Phineas Semen, MD  oxyCODONE-acetaminophen (ROXICET) 5-325 MG tablet Take 1 tablet by mouth every 4 (four) hours as needed for severe pain. 10/22/16   Darci Current, MD  propranolol (INDERAL) 10 MG tablet Take 1 tablet (10 mg total) by mouth 2 (two) times daily. Patient not taking: Reported on 02/05/2016 01/15/16 01/29/16  Jennye Moccasin, MD    Allergies Patient has no known allergies.  No family history on file.  Social History Social History   Tobacco Use  . Smoking status: Never Smoker  . Smokeless tobacco: Never Used  Substance Use Topics  . Alcohol use: No  . Drug use: No    Review of Systems Constitutional: No fever/chills  Eyes: No visual changes. ENT: No sore throat. Cardiovascular: Denies chest pain. Respiratory: Positive for congestion.  Gastrointestinal: Positive for abdominal pain. Genitourinary: Negative for dysuria. Musculoskeletal: Positive for upper back pain. Skin: Negative for rash. Neurological: Negative for headaches, focal weakness or numbness.  ____________________________________________   PHYSICAL EXAM:  VITAL SIGNS: ED Triage Vitals  Enc Vitals Group     BP 03/10/20 2254 (!) 153/86     Pulse Rate 03/10/20 2254 91     Resp  03/10/20 2254 18     Temp 03/10/20 2254 98.2 F (36.8 C)     Temp Source 03/10/20 2254 Oral     SpO2 03/10/20 2254 95 %     Weight 03/10/20 2255 195 lb (88.5 kg)     Height 03/10/20 2255 5\' 3"  (1.6 m)     Head Circumference --      Peak Flow --      Pain Score 03/10/20 2254 7   Constitutional: Alert and oriented.  Eyes: Conjunctivae are normal.  ENT      Head: Normocephalic and atraumatic.      Nose: No congestion/rhinnorhea.      Mouth/Throat: Mucous membranes are moist.      Neck: No stridor. Hematological/Lymphatic/Immunilogical: No cervical lymphadenopathy. Cardiovascular: Normal rate, regular rhythm.  No murmurs, rubs, or gallops.  Respiratory: Normal respiratory effort without tachypnea nor retractions. Breath sounds are clear and equal bilaterally. No wheezes/rales/rhonchi. Gastrointestinal: Soft and non tender. No rebound. No guarding.  Genitourinary: Deferred Musculoskeletal: Normal range of motion in all extremities. No lower extremity edema. Neurologic:  Normal speech and language. No gross focal neurologic deficits are appreciated.  Skin:  Skin is warm, dry and intact. No rash noted. Psychiatric: Mood and affect are normal. Speech and behavior are normal. Patient exhibits appropriate insight and judgment.  ____________________________________________    LABS (pertinent positives/negatives)  CMP wnl except glu 111 CBC wbc 8.2 hgb 13.9 plt 227 UA clear, small hgb dipstick, trace leukocytes, rare bacteria COVID negative ____________________________________________   EKG  None  ____________________________________________    RADIOLOGY  None  ____________________________________________   PROCEDURES  Procedures  ____________________________________________   INITIAL IMPRESSION / ASSESSMENT AND PLAN / ED COURSE  Pertinent labs & imaging results that were available during my care of the patient were reviewed by me and considered in my medical  decision making (see chart for details).   Patient presented to the emergency department today because of concerns for recurrent Covid.  Patient did have it at the end of last year.  Patient's Covid was negative here.  Blood work without other concerning findings.  Discussed this with the patient.  Will plan on discharging.  ____________________________________________   FINAL CLINICAL IMPRESSION(S) / ED DIAGNOSES  Final diagnoses:  Thoracic back pain, unspecified back pain laterality, unspecified chronicity     Note: This dictation was prepared with Dragon dictation. Any transcriptional errors that result from this process are unintentional     Nance Pear, MD 03/11/20 [phone]

## 2020-03-11 NOTE — ED Notes (Signed)
Patient c/o sore throat, congestion, abdominal pain, N/D. Patient reports that she was dx with COVID 12/17 and reports symptoms similar to when she had COVID.

## 2020-03-11 NOTE — Discharge Instructions (Signed)
Please seek medical attention for any high fevers, chest pain, shortness of breath, change in behavior, persistent vomiting, bloody stool or any other new or concerning symptoms.  

## 2020-03-11 NOTE — ED Notes (Signed)
Reviewed discharge instructions, follow-up care with patient. Patient verbalized understanding of all information reviewed. Patient stable, with no distress noted at this time.    

## 2020-05-20 ENCOUNTER — Emergency Department: Payer: Self-pay

## 2020-05-20 ENCOUNTER — Other Ambulatory Visit: Payer: Self-pay

## 2020-05-20 ENCOUNTER — Emergency Department
Admission: EM | Admit: 2020-05-20 | Discharge: 2020-05-20 | Disposition: A | Discharge status: Home / Self Care | Payer: Self-pay | Attending: Emergency Medicine | Admitting: Emergency Medicine

## 2020-05-20 ENCOUNTER — Encounter: Payer: Self-pay | Admitting: Emergency Medicine

## 2020-05-20 DIAGNOSIS — B349 Viral infection, unspecified: Secondary | ICD-10-CM | POA: Insufficient documentation

## 2020-05-20 DIAGNOSIS — Z20822 Contact with and (suspected) exposure to covid-19: Secondary | ICD-10-CM | POA: Insufficient documentation

## 2020-05-20 DIAGNOSIS — R0981 Nasal congestion: Secondary | ICD-10-CM | POA: Insufficient documentation

## 2020-05-20 LAB — CBC WITH DIFFERENTIAL/PLATELET
Abs Immature Granulocytes: 0.04 10*3/uL (ref 0.00–0.07)
Basophils Absolute: 0.1 10*3/uL (ref 0.0–0.1)
Basophils Relative: 1 %
Eosinophils Absolute: 0.1 10*3/uL (ref 0.0–0.5)
Eosinophils Relative: 1 %
HCT: 41 % (ref 36.0–46.0)
Hemoglobin: 14 g/dL (ref 12.0–15.0)
Immature Granulocytes: 1 %
Lymphocytes Relative: 25 %
Lymphs Abs: 2.1 10*3/uL (ref 0.7–4.0)
MCH: 29.4 pg (ref 26.0–34.0)
MCHC: 34.1 g/dL (ref 30.0–36.0)
MCV: 86.1 fL (ref 80.0–100.0)
Monocytes Absolute: 0.4 10*3/uL (ref 0.1–1.0)
Monocytes Relative: 5 %
Neutro Abs: 5.7 10*3/uL (ref 1.7–7.7)
Neutrophils Relative %: 67 %
Platelets: 244 10*3/uL (ref 150–400)
RBC: 4.76 MIL/uL (ref 3.87–5.11)
RDW: 13.1 % (ref 11.5–15.5)
WBC: 8.4 10*3/uL (ref 4.0–10.5)
nRBC: 0 % (ref 0.0–0.2)

## 2020-05-20 LAB — URINALYSIS, COMPLETE (UACMP) WITH MICROSCOPIC
Bilirubin Urine: NEGATIVE
Glucose, UA: NEGATIVE mg/dL
Ketones, ur: NEGATIVE mg/dL
Leukocytes,Ua: NEGATIVE
Nitrite: NEGATIVE
Protein, ur: NEGATIVE mg/dL
Specific Gravity, Urine: 1.005 (ref 1.005–1.030)
pH: 6 (ref 5.0–8.0)

## 2020-05-20 LAB — BASIC METABOLIC PANEL
Anion gap: 7 (ref 5–15)
BUN: 14 mg/dL (ref 8–23)
CO2: 24 mmol/L (ref 22–32)
Calcium: 9.2 mg/dL (ref 8.9–10.3)
Chloride: 109 mmol/L (ref 98–111)
Creatinine, Ser: 0.99 mg/dL (ref 0.44–1.00)
GFR calc Af Amer: >60 mL/min (ref >60)
GFR calc non Af Amer: 59 mL/min — ABNORMAL LOW (ref >60)
Glucose, Bld: 132 mg/dL — ABNORMAL HIGH (ref 70–99)
Potassium: 3.9 mmol/L (ref 3.5–5.1)
Sodium: 140 mmol/L (ref 135–145)

## 2020-05-20 LAB — RESPIRATORY PANEL BY RT PCR (FLU A&B, COVID)
Influenza A by PCR: NEGATIVE
Influenza B by PCR: NEGATIVE
SARS Coronavirus 2 by RT PCR: NEGATIVE

## 2020-05-20 MED ORDER — PREDNISONE 20 MG PO TABS
20.0000 mg | ORAL_TABLET | Freq: Two times a day (BID) | ORAL | 0 refills | Status: AC
Start: 2020-05-20 — End: 2020-05-25

## 2020-05-20 MED ORDER — DEXAMETHASONE SODIUM PHOSPHATE 10 MG/ML IJ SOLN
10.0000 mg | Freq: Once | INTRAMUSCULAR | Status: AC
  Administered 2020-05-20: 10 mg via INTRAMUSCULAR
  Filled 2020-05-20: qty 1

## 2020-05-20 MED ORDER — FLUTICASONE PROPIONATE 50 MCG/ACT NA SUSP: 2.0000 | Freq: Every day | NASAL | 0 refills | Status: DC

## 2020-05-20 MED ORDER — BENZONATATE 100 MG PO CAPS
ORAL_CAPSULE | ORAL | 0 refills | Status: DC
Start: 2020-05-20 — End: 2020-11-12

## 2020-05-20 NOTE — ED Triage Notes (Signed)
Pt to ED via POV c/o body aches, stuffy nose, chills, and sore throat. Pt states that she started feeling bad Tuesday but she was unable to sleep last night and symptoms got worse today. Pt is in NAD at this time.

## 2020-05-20 NOTE — ED Notes (Signed)
Pt verbalized understanding of discharge instructions. NAD at this time. 

## 2020-05-20 NOTE — ED Provider Notes (Signed)
[facility] Emergency Department Provider Note ____________________________________________  Time seen: 1619  I have reviewed the triage vital signs and the nursing notes.  HISTORY  Chief Complaint  Generalized Body Aches, Chills, and Nasal Congestion  HPI Jillian Thomas is a 69 y.o. female presents herself to the ED for evaluation of several days of chills, body aches, sore throat, cough, and malaise.  Patient started feeling bad on Tuesday, and was unable to sleep last night due to restlessness secondary to body aches.  She reports symptoms worsened today, was present for evaluation.  She has previously been tested for Covid Covid and did have positive Covid test in December 2020.  Patient denies any frank fevers, nausea, vomiting, or diarrhea.  Patient also denies any chest pain, shortness of breath, taste/smell sensation changes, or syncope.  She took TheraFlu last night with limited benefit but not taking any other medications since onset of her symptoms.  She has not received the COVID-19 vaccine.  Past Medical History:  Diagnosis Date  . COVID-19     Patient Active Problem List   Diagnosis Date Noted  . Palpitations 03/14/2016  . Obesity 03/14/2016    Past Surgical History:  Procedure Laterality Date  . CHOLECYSTECTOMY    . TUBAL LIGATION      Prior to Admission medications   Medication Sig Start Date End Date Taking? Authorizing Provider  albuterol (PROVENTIL HFA;VENTOLIN HFA) 108 (90 Base) MCG/ACT inhaler Inhale 2 puffs into the lungs every 6 (six) hours as needed for wheezing or shortness of breath. 05/30/16   Schaevitz, Myra Rude, MD  benzonatate (TESSALON PERLES) 100 MG capsule Take 1-2 tabs TID prn cough 05/20/20   Erek Kowal, Charlesetta Ivory, PA-C  cephALEXin (KEFLEX) 500 MG capsule Take 1 capsule (500 mg total) by mouth 2 (two) times daily. 11/02/19   Jene Every, MD  chlorpheniramine-HYDROcodone [facility]) 10-8 MG/5ML SUER  Take 5 mLs by mouth every 12 (twelve) hours as needed for cough. 11/02/19   Jene Every, MD  fluticasone (FLONASE) 50 MCG/ACT nasal spray Place 2 sprays into both nostrils daily. 05/20/20   Malacai Grantz, Charlesetta Ivory, PA-C  HYDROcodone-acetaminophen (NORCO/VICODIN) 5-325 MG tablet Take 1 tablet by mouth every 4 (four) hours as needed for moderate pain. 01/11/20 01/10/21  Darci Current, MD  lidocaine (XYLOCAINE) 2 % solution Use as directed 5 mLs in the mouth or throat every 6 (six) hours as needed for mouth pain. Swish and slow swallow. 05/09/19   Joni Reining, PA-C  loratadine (CLARITIN) 10 MG tablet Take 1 tablet (10 mg total) by mouth daily. 04/18/16   Hagler, Jami L, PA-C  magic mouthwash SOLN Take 5 mLs by mouth 4 (four) times daily as needed for mouth pain. Please mix 48mL of each ingredient totaling 04/18/16   Hagler, Jami L, PA-C  nitroGLYCERIN (NITROSTAT) 0.4 MG SL tablet Place 1 tablet (0.4 mg total) under the tongue every 5 (five) minutes as needed for chest pain. 02/05/16   Almond Lint, MD  ondansetron (ZOFRAN) 4 MG tablet Take 1 tablet (4 mg total) by mouth every 8 (eight) hours as needed. 06/01/16   Phineas Semen, MD  oxyCODONE-acetaminophen (ROXICET) 5-325 MG tablet Take 1 tablet by mouth every 4 (four) hours as needed for severe pain. 10/22/16   Darci Current, MD  predniSONE (DELTASONE) 20 MG tablet Take 1 tablet (20 mg total) by mouth 2 (two) times daily with a meal for 5 days. 05/20/20 05/25/20  Karra Pink, Marisue Humble  Marcelyn Bruins, PA-C  propranolol (INDERAL) 10 MG tablet Take 1 tablet (10 mg total) by mouth 2 (two) times daily. Patient not taking: Reported on 02/05/2016 01/15/16 01/29/16  Jennye Moccasin, MD    Allergies Patient has no known allergies.  History reviewed. No pertinent family history.  Social History Social History   Tobacco Use  . Smoking status: Never Smoker  . Smokeless tobacco: Never Used  Substance Use Topics  . Alcohol use: No  . Drug use: No    Review  of Systems  Constitutional: Negative for fever.  Reports chills and malaise.   Eyes: Negative for visual changes. ENT: Positive for sore throat.  Reports nasal congestion. Cardiovascular: Negative for chest pain. Respiratory: Negative for shortness of breath. Gastrointestinal: Negative for abdominal pain, vomiting and diarrhea. Genitourinary: Negative for dysuria. Musculoskeletal: Negative for back pain.  Reports generalized body aches. Skin: Negative for rash. Neurological: Negative for headaches, focal weakness or numbness. ____________________________________________  PHYSICAL EXAM:  VITAL SIGNS: ED Triage Vitals  Enc Vitals Group     BP 05/20/20 1522 (!) 152/93     Pulse Rate 05/20/20 1522 89     Resp 05/20/20 1522 16     Temp 05/20/20 1522 98.1 F (36.7 C)     Temp Source 05/20/20 1522 Oral     SpO2 05/20/20 1522 96 %     Weight 05/20/20 1520 190 lb (86.2 kg)     Height 05/20/20 1520 5\' 3"  (1.6 m)     Head Circumference --      Peak Flow --      Pain Score 05/20/20 1520 8     Pain Loc --      Pain Edu? --      Excl. in GC? --     Constitutional: Alert and oriented. Well appearing and in no distress. Head: Normocephalic and atraumatic. Eyes: Conjunctivae are normal. PERRL. Normal extraocular movements Ears: Canals clear. TMs intact bilaterally. Nose: No congestion/rhinorrhea/epistaxis. Mouth/Throat: Mucous membranes are moist. Neck: Supple. No thyromegaly. Hematological/Lymphatic/Immunological: No cervical lymphadenopathy. Cardiovascular: Normal rate, regular rhythm. Normal distal pulses. Respiratory: Normal respiratory effort. No wheezes/rales/rhonchi. Gastrointestinal: Soft and nontender. No distention. Musculoskeletal: Nontender with normal range of motion in all extremities.  Neurologic:  Normal gait without ataxia. Normal speech and language. No gross focal neurologic deficits are appreciated. Skin:  Skin is warm, dry and intact. No rash noted. Psychiatric:  Mood and affect are normal. Patient exhibits appropriate insight and judgment. ____________________________________________   LABS (pertinent positives/negatives) Labs Reviewed  URINALYSIS, COMPLETE (UACMP) WITH MICROSCOPIC - Abnormal; Notable for the following components:      Result Value   Color, Urine STRAW (*)    APPearance CLEAR (*)    Hgb urine dipstick SMALL (*)    Bacteria, UA RARE (*)    All other components within normal limits  BASIC METABOLIC PANEL - Abnormal; Notable for the following components:   Glucose, Bld 132 (*)    GFR calc non Af Amer 59 (*)    All other components within normal limits  RESPIRATORY PANEL BY RT PCR (FLU A&B, COVID)  CBC WITH DIFFERENTIAL/PLATELET  ____________________________________________   RADIOLOGY  CXR  IMPRESSION: No active cardiopulmonary disease. ____________________________________________  PROCEDURES  Decadron 10 mg IM  Procedures ____________________________________________  INITIAL IMPRESSION / ASSESSMENT AND PLAN / ED COURSE  Differential includes, but is not limited to, viral syndrome, bronchitis including COPD exacerbation, pneumonia, reactive airway disease including asthma, CHF including exacerbation with or without pulmonary/interstitial edema, pneumothorax, ACS,  thoracic trauma, and pulmonary embolism.  Patient presents to the ED for evaluation of several days complaint of body aches, sinus chills and sore throat.  Patient exam is overall benign reassuring at this time.  No signs of acute infectious process.  X-rays negative for any acute cardiopulmonary process.  Patient be treated with steroids, Tessalon Perles, and Flonase.  She is encouraged to follow-up with her primary provider for ongoing symptom management.  Return precautions have been reviewed.  Serina C Strack was evaluated in Emergency Department on 05/20/2020 for the symptoms described in the history of present illness. She was evaluated in the context of  the global COVID-19 pandemic, which necessitated consideration that the patient might be at risk for infection with the SARS-CoV-2 virus that causes COVID-19. Institutional protocols and algorithms that pertain to the evaluation of patients at risk for COVID-19 are in a state of rapid change based on information released by regulatory bodies including the CDC and federal and state organizations. These policies and algorithms were followed during the patient's care in the ED. ____________________________________________  FINAL CLINICAL IMPRESSION(S) / ED DIAGNOSES  Final diagnoses:  Viral illness  Nasal congestion      Karmen Stabs, Charlesetta Ivory, PA-C 05/20/20 2325    Minna Antis, MD 05/20/20 2327

## 2020-05-20 NOTE — Discharge Instructions (Signed)
Your exam, labs, and CXR are all normal or negative. You will be treated for a viral URI with steroids and cough medicines. Take OTC allergy medicine as needed. Follow-up with Dr. Seward Carol as needed.

## 2020-05-20 NOTE — ED Notes (Signed)
Pt is ambulatory to the room.  Pt states symptoms began on Tuesday (sore throat, achey, stuffy nose, chills) and are getting progressively worse; has not taken any Tylenol or Ibuprofen.  Pt does not appear to be in any acute distress at this time.

## 2020-05-28 ENCOUNTER — Other Ambulatory Visit: Payer: Self-pay

## 2020-05-28 ENCOUNTER — Emergency Department: Payer: Self-pay

## 2020-05-28 ENCOUNTER — Emergency Department
Admission: EM | Admit: 2020-05-28 | Discharge: 2020-05-29 | Disposition: A | Discharge status: Home / Self Care | Payer: Self-pay | Attending: Emergency Medicine | Admitting: Emergency Medicine

## 2020-05-28 DIAGNOSIS — N390 Urinary tract infection, site not specified: Secondary | ICD-10-CM | POA: Insufficient documentation

## 2020-05-28 DIAGNOSIS — R1030 Lower abdominal pain, unspecified: Secondary | ICD-10-CM | POA: Insufficient documentation

## 2020-05-28 DIAGNOSIS — H538 Other visual disturbances: Secondary | ICD-10-CM | POA: Insufficient documentation

## 2020-05-28 LAB — TROPONIN I (HIGH SENSITIVITY): Troponin I (High Sensitivity): 3 ng/L (ref <18)

## 2020-05-28 LAB — CBC
HCT: 41.5 % (ref 36.0–46.0)
Hemoglobin: 14.1 g/dL (ref 12.0–15.0)
MCH: 29.9 pg (ref 26.0–34.0)
MCHC: 34 g/dL (ref 30.0–36.0)
MCV: 88.1 fL (ref 80.0–100.0)
Platelets: 259 10*3/uL (ref 150–400)
RBC: 4.71 MIL/uL (ref 3.87–5.11)
RDW: 13.1 % (ref 11.5–15.5)
WBC: 8.7 10*3/uL (ref 4.0–10.5)
nRBC: 0 % (ref 0.0–0.2)

## 2020-05-28 LAB — COMPREHENSIVE METABOLIC PANEL
ALT: 18 U/L (ref 0–44)
AST: 16 U/L (ref 15–41)
Albumin: 4 g/dL (ref 3.5–5.0)
Alkaline Phosphatase: 63 U/L (ref 38–126)
Anion gap: 5 (ref 5–15)
BUN: 16 mg/dL (ref 8–23)
CO2: 32 mmol/L (ref 22–32)
Calcium: 8.9 mg/dL (ref 8.9–10.3)
Chloride: 102 mmol/L (ref 98–111)
Creatinine, Ser: 1 mg/dL (ref 0.44–1.00)
GFR calc Af Amer: >60 mL/min (ref >60)
GFR calc non Af Amer: 58 mL/min — ABNORMAL LOW (ref >60)
Glucose, Bld: 158 mg/dL — ABNORMAL HIGH (ref 70–99)
Potassium: 4 mmol/L (ref 3.5–5.1)
Sodium: 139 mmol/L (ref 135–145)
Total Bilirubin: 0.8 mg/dL (ref 0.3–1.2)
Total Protein: 6.6 g/dL (ref 6.5–8.1)

## 2020-05-28 LAB — URINALYSIS, COMPLETE (UACMP) WITH MICROSCOPIC
Bilirubin Urine: NEGATIVE
Glucose, UA: NEGATIVE mg/dL
Hgb urine dipstick: NEGATIVE
Ketones, ur: NEGATIVE mg/dL
Nitrite: NEGATIVE
Protein, ur: NEGATIVE mg/dL
Specific Gravity, Urine: 1.015 (ref 1.005–1.030)
pH: 8 (ref 5.0–8.0)

## 2020-05-28 LAB — LIPASE, BLOOD: Lipase: 40 U/L (ref 11–51)

## 2020-05-28 NOTE — ED Notes (Signed)
Hat placed in toilet in case pt able to have BM although states she has not had one in the 4 hours she's been here

## 2020-05-28 NOTE — ED Triage Notes (Signed)
Pt comes POV after finishing course of prednisone for URI on Friday. Pt states that since then she has had lower pelvic/abdominal pain and has had vision trouble out of her right eye. Pt has had diarrhea since Friday.

## 2020-05-28 NOTE — ED Notes (Signed)
Lab called to add on trop 

## 2020-05-28 NOTE — ED Provider Notes (Signed)
[facility] Emergency Department Provider Note   ____________________________________________   First MD Initiated Contact with Patient 05/28/20 2308     (approximate)  I have reviewed the triage vital signs and the nursing notes.   HISTORY  Chief Complaint Abdominal Pain    HPI Jillian Thomas is a 69 y.o. female who presents to the ED from home with a chief complaint of lower abdominal pain and blurry vision out of her right eye and diarrhea x3 to 4 days.  Patient just finished a course of prednisone for URI last Friday.  Since that time she has been experiencing lower abdominal pain without nausea/vomiting.  Has also been experiencing blurry vision out of her right eye for the past 1 to 2 days, none currently.  Sometimes associated with mild headache.  Had one episode of "black diarrhea".  Denies fever, chills, cough, chest pain, shortness of breath.  Denies slurred speech, extremity weakness, numbness or tingling.  Denies anticoagulant use.  Denies recent travel or trauma.      Past Medical History:  Diagnosis Date  . COVID-19     Patient Active Problem List   Diagnosis Date Noted  . Palpitations 03/14/2016  . Obesity 03/14/2016    Past Surgical History:  Procedure Laterality Date  . CHOLECYSTECTOMY    . TUBAL LIGATION      Prior to Admission medications   Medication Sig Start Date End Date Taking? Authorizing Provider  benzonatate (TESSALON PERLES) 100 MG capsule Take 1-2 tabs TID prn cough 05/20/20   Menshew, Charlesetta IvoryJenise V Bacon, PA-C  cephALEXin (KEFLEX) 500 MG capsule Take 1 capsule (500 mg total) by mouth 3 (three) times daily. 05/29/20   Irean HongSung, Armon Orvis J, MD  chlorpheniramine-HYDROcodone [facility](TUSSIONEX PENNKINETIC ER) 10-8 MG/5ML SUER Take 5 mLs by mouth every 12 (twelve) hours as needed for cough. 11/02/19   Jene EveryKinner, Robert, MD  fluticasone (FLONASE) 50 MCG/ACT nasal spray Place 2 sprays into both nostrils daily. 05/20/20   Menshew, Charlesetta IvoryJenise V Bacon,  PA-C  lidocaine (XYLOCAINE) 2 % solution Use as directed 5 mLs in the mouth or throat every 6 (six) hours as needed for mouth pain. Swish and slow swallow. 05/09/19   Joni ReiningSmith, Ronald K, PA-C  nitroGLYCERIN (NITROSTAT) 0.4 MG SL tablet Place 1 tablet (0.4 mg total) under the tongue every 5 (five) minutes as needed for chest pain. 02/05/16   Almond LintIngal, Aileen, MD  ondansetron (ZOFRAN) 4 MG tablet Take 1 tablet (4 mg total) by mouth every 8 (eight) hours as needed. 06/01/16   Phineas SemenGoodman, Graydon, MD  propranolol (INDERAL) 10 MG tablet Take 1 tablet (10 mg total) by mouth 2 (two) times daily. Patient not taking: Reported on 02/05/2016 01/15/16 01/29/16  Jennye MoccasinQuigley, Brian S, MD  albuterol (PROVENTIL HFA;VENTOLIN HFA) 108 [phone](90 Base) MCG/ACT inhaler Inhale 2 puffs into the lungs every 6 (six) hours as needed for wheezing or shortness of breath. 05/30/16 05/20/20  Schaevitz, Myra Rudeavid Matthew, MD  loratadine (CLARITIN) 10 MG tablet Take 1 tablet (10 mg total) by mouth daily. 04/18/16 05/20/20  Hagler, Jami L, PA-C    Allergies Patient has no known allergies.  History reviewed. No pertinent family history.  Social History Social History   Tobacco Use  . Smoking status: Never Smoker  . Smokeless tobacco: Never Used  Substance Use Topics  . Alcohol use: No  . Drug use: No    Review of Systems  Constitutional: No fever/chills Eyes: Positive for right blurry vision. ENT: No sore throat. Cardiovascular: Denies  chest pain. Respiratory: Denies shortness of breath. Gastrointestinal: Positive for abdominal pain.  No nausea, no vomiting.  Positive for diarrhea.  No constipation. Genitourinary: Negative for dysuria. Musculoskeletal: Negative for back pain. Skin: Negative for rash. Neurological: Negative for headaches, focal weakness or numbness.   ____________________________________________   PHYSICAL EXAM:  VITAL SIGNS: ED Triage Vitals [05/28/20 1852]  Enc Vitals Group     BP 125/70     Pulse Rate 98     Resp 18      Temp 98.3 F (36.8 C)     Temp Source Oral     SpO2 98 %     Weight 185 lb (83.9 kg)     Height 5\' 3"  (1.6 m)     Head Circumference      Peak Flow      Pain Score 8     Pain Loc      Pain Edu?      Excl. in GC?     Constitutional: Alert and oriented. Well appearing and in no acute distress. Eyes: Conjunctivae are normal. PERRL. EOMI. Head: Atraumatic. Nose: No congestion/rhinnorhea. Mouth/Throat: Mucous membranes are moist.   Neck: No stridor.  Supple neck without meningismus.  No carotid bruits. Cardiovascular: Normal rate, regular rhythm. Grossly normal heart sounds.  Good peripheral circulation. Respiratory: Normal respiratory effort.  No retractions. Lungs CTAB. Gastrointestinal: Soft and minimally tender to palpation suprapubic area without rebound or guarding. No distention. No abdominal bruits. No CVA tenderness. Rectal: Large external reducible nonthrombosed nonbleeding hemorrhoid.  Tan stool on gloved finger which is guaiac negative. Musculoskeletal: No lower extremity tenderness nor edema.  No joint effusions. Neurologic: Alert and oriented x3.  CN II-XII grossly intact.  Normal speech and language. No gross focal neurologic deficits are appreciated. No gait instability. Skin:  Skin is warm, dry and intact. No rash noted.  No petechiae. Psychiatric: Mood and affect are normal. Speech and behavior are normal.  ____________________________________________   LABS (all labs ordered are listed, but only abnormal results are displayed)  Labs Reviewed  COMPREHENSIVE METABOLIC PANEL - Abnormal; Notable for the following components:      Result Value   Glucose, Bld 158 (*)    GFR calc non Af Amer 58 (*)    All other components within normal limits  URINALYSIS, COMPLETE (UACMP) WITH MICROSCOPIC - Abnormal; Notable for the following components:   Color, Urine YELLOW (*)    APPearance CLOUDY (*)    Leukocytes,Ua SMALL (*)    Bacteria, UA RARE (*)    All other  components within normal limits  LIPASE, BLOOD  CBC  TROPONIN I (HIGH SENSITIVITY)   ____________________________________________  EKG  ED ECG REPORT I, Cassell Voorhies J, the attending physician, personally viewed and interpreted this ECG.   Date: 05/28/2020  EKG Time: 2351  Rate: 68  Rhythm: normal EKG, normal sinus rhythm  Axis: Normal  Intervals:none  ST&T Change: Nonspecific  ____________________________________________  RADIOLOGY  ED MD interpretation: CT demonstrates no ICH  Official radiology report(s): CT HEAD WO CONTRAST  Result Date: 05/28/2020 CLINICAL DATA:  Possible stroke; numbness or tingling, paresthesia. Additional provided: Patient recently finished a course of prednisone for URI, lower pelvic/abdominal pain, right eye vision trouble. EXAM: CT HEAD WITHOUT CONTRAST TECHNIQUE: Contiguous axial images were obtained from the base of the skull through the vertex without intravenous contrast. COMPARISON:  Report from head CT 05/26/1997 (images unavailable). FINDINGS: Brain: Mild generalized parenchymal atrophy. Moderate to moderately severe patchy hypodensity within the cerebral white  matter is advanced for age and nonspecific, but most commonly seen on the basis of chronic small vessel ischemia. There is no acute intracranial hemorrhage. No demarcated cortical infarct is identified. No extra-axial fluid collection. No evidence of intracranial mass. No midline shift. Vascular: No hyperdense vessel. Skull: Normal. Negative for fracture or focal lesion. Sinuses/Orbits: Visualized orbits show no acute finding. No significant paranasal sinus disease or mastoid effusion at the imaged levels. IMPRESSION: No CT evidence of acute intracranial abnormality. Consider brain MRI to better assess for acute infarct, as clinically warranted. Moderate to moderately-severe cerebral white matter disease is advanced for age and nonspecific, but most commonly seen on the basis of chronic small vessel  ischemia. Mild generalized parenchymal atrophy. Electronically Signed   By: Jackey Loge DO   On: 05/28/2020 20:12   MR BRAIN WO CONTRAST  Result Date: 05/29/2020 CLINICAL DATA:  Headache and blurry vision EXAM: MRI HEAD WITHOUT CONTRAST TECHNIQUE: Multiplanar, multiecho pulse sequences of the brain and surrounding structures were obtained without intravenous contrast. COMPARISON:  None. FINDINGS: BRAIN: No acute infarct, acute hemorrhage or extra-axial collection. Early confluent hyperintense T2-weighted signal of the periventricular and deep white matter, most commonly due to chronic ischemic microangiopathy. There is generalized atrophy without lobar predilection. No chronic microhemorrhage. Normal midline structures. VASCULAR: Major flow voids are preserved. SKULL AND UPPER CERVICAL SPINE: Normal calvarium and skull base. Visualized upper cervical spine and soft tissues are normal. SINUSES/ORBITS: No paranasal sinus fluid levels or advanced mucosal thickening. No mastoid or middle ear effusion. Normal orbits. IMPRESSION: 1. No acute intracranial abnormality. 2. Generalized atrophy and findings of chronic ischemic microangiopathy. Electronically Signed   By: Deatra Robinson M.D.   On: 05/29/2020 01:22   CT Renal Stone Study  Result Date: 05/28/2020 CLINICAL DATA:  Lower abdominal pain. EXAM: CT ABDOMEN AND PELVIS WITHOUT CONTRAST TECHNIQUE: Multidetector CT imaging of the abdomen and pelvis was performed following the standard protocol without IV contrast. COMPARISON:  November 02, 2019 FINDINGS: Lower chest: No acute abnormality. Hepatobiliary: No focal liver abnormality is seen. Status post cholecystectomy. No biliary dilatation. Pancreas: Unremarkable. No pancreatic ductal dilatation or surrounding inflammatory changes. Spleen: Normal in size without focal abnormality. Adrenals/Urinary Tract: Adrenal glands are unremarkable. Kidneys are normal in size. A stable 6 mm isodense area is seen along the  anterolateral aspect of the mid left kidney. Bilateral 2 mm nonobstructing renal stones are seen. There is no evidence of hydronephrosis. Bladder is unremarkable. Stomach/Bowel: There is a small hiatal hernia. The appendix is not clearly identified. No evidence of bowel wall thickening, distention, or inflammatory changes. Vascular/Lymphatic: There is mild calcification of the abdominal aorta. No enlarged abdominal or pelvic lymph nodes. Reproductive: Uterus and bilateral adnexa are unremarkable. Other: No abdominal wall hernia or abnormality. No abdominopelvic ascites. Musculoskeletal: No acute or significant osseous findings. IMPRESSION: 1. Bilateral 2 mm nonobstructing renal stones. 2. Small hiatal hernia. 3. Evidence of prior cholecystectomy. Aortic Atherosclerosis (ICD10-I70.0). Electronically Signed   By: Aram Candela M.D.   On: 05/28/2020 23:48    ____________________________________________   PROCEDURES  Procedure(s) performed (including Critical Care):  .1-3 Lead EKG Interpretation Performed by: Irean Hong, MD Authorized by: Irean Hong, MD     Interpretation: normal     ECG rate:  75   ECG rate assessment: normal     Rhythm: sinus rhythm     Ectopy: none     Conduction: normal   Comments:     Patient placed on cardiac monitor to  evaluate for arrhythmias   NIH Stroke Scale  Interval: Baseline Time: 2:39 AM Person Administering Scale: Ayane Delancey J  Administer stroke scale items in the order listed. Record performance in each category after each subscale exam. Do not go back and change scores. Follow directions provided for each exam technique. Scores should reflect what the patient does, not what the clinician thinks the patient can do. The clinician should record answers while administering the exam and work quickly. Except where indicated, the patient should not be coached (i.e., repeated requests to patient to make a special effort).   1a  Level of consciousness:  0=alert; keenly responsive  1b. LOC questions:  0=Performs both tasks correctly  1c. LOC commands: 0=Performs both tasks correctly  2.  Best Gaze: 0=normal  3.  Visual: 0=No visual loss  4. Facial Palsy: 0=Normal symmetric movement  5a.  Motor left arm: 0=No drift, limb holds 90 (or 45) degrees for full 10 seconds  5b.  Motor right arm: 0=No drift, limb holds 90 (or 45) degrees for full 10 seconds  6a. motor left leg: 0=No drift, limb holds 90 (or 45) degrees for full 10 seconds  6b  Motor right leg:  0=No drift, limb holds 90 (or 45) degrees for full 10 seconds  7. Limb Ataxia: 0=Absent  8.  Sensory: 0=Normal; no sensory loss  9. Best Language:  0=No aphasia, normal  10. Dysarthria: 0=Normal  11. Extinction and Inattention: 0=No abnormality  12. Distal motor function: 0=Normal   Total:   0     ____________________________________________   INITIAL IMPRESSION / ASSESSMENT AND PLAN / ED COURSE  As part of my medical decision making, I reviewed the following data within the electronic MEDICAL RECORD NUMBER Nursing notes reviewed and incorporated, Labs reviewed, EKG interpreted, Old chart reviewed, Radiograph reviewed and Notes from prior ED visits     Jillian Thomas was evaluated in Emergency Department on 05/29/2020 for the symptoms described in the history of present illness. She was evaluated in the context of the global COVID-19 pandemic, which necessitated consideration that the patient might be at risk for infection with the SARS-CoV-2 virus that causes COVID-19. Institutional protocols and algorithms that pertain to the evaluation of patients at risk for COVID-19 are in a state of rapid change based on information released by regulatory bodies including the CDC and federal and state organizations. These policies and algorithms were followed during the patient's care in the ED.    69 year old female presenting with multiple medical complaints after completion of prednisone - lower  abdominal pain, right blurry vision, diarrhea. Differential diagnosis includes, but is not limited to, ovarian cyst, ovarian torsion, acute appendicitis, diverticulitis, urinary tract infection/pyelonephritis, endometriosis, bowel obstruction, colitis, renal colic, gastroenteritis, hernia, fibroids, endometriosis, etc.  Laboratory results remarkable for leukocyte positive UTI.  Will obtain noncontrast CT abdomen/pelvis and MRI to evaluate for stroke given patient's complaint of blurry vision.      ____________________________________________   FINAL CLINICAL IMPRESSION(S) / ED DIAGNOSES  Final diagnoses:  Lower abdominal pain  Lower urinary tract infectious disease  Blurry vision, right eye     ED Discharge Orders         Ordered    cephALEXin (KEFLEX) 500 MG capsule  3 times daily     Discontinue  Reprint     05/29/20 0138           Note:  This document was prepared using Dragon voice recognition software and may include unintentional dictation errors.  Irean Hong, MD 05/29/20 [phone]

## 2020-05-29 ENCOUNTER — Emergency Department: Payer: Self-pay

## 2020-05-29 MED ORDER — CEPHALEXIN 500 MG PO CAPS
500.0000 mg | ORAL_CAPSULE | Freq: Once | ORAL | Status: AC
  Administered 2020-05-29: 500 mg via ORAL
  Filled 2020-05-29: qty 1

## 2020-05-29 MED ORDER — CEPHALEXIN 500 MG PO CAPS
500.0000 mg | ORAL_CAPSULE | Freq: Three times a day (TID) | ORAL | 0 refills | Status: DC
Start: 2020-05-29 — End: 2021-06-08

## 2020-05-29 NOTE — ED Notes (Signed)
Patient transported to MRI 

## 2020-05-29 NOTE — ED Notes (Signed)
PT back from MRI PT has not had BM

## 2020-05-29 NOTE — Discharge Instructions (Addendum)
1.  Take antibiotic as prescribed (Keflex 500 mg 3 times daily x7 days). 2.  Return to the ER for worsening symptoms, persistent vomiting, difficulty breathing or other concerns. 

## 2020-06-19 ENCOUNTER — Ambulatory Visit
Admission: RE | Admit: 2020-06-19 | Discharge: 2020-06-19 | Disposition: A | Discharge status: Home / Self Care | Payer: Self-pay | Source: Ambulatory Visit | Attending: Physician Assistant | Admitting: Physician Assistant

## 2020-06-19 ENCOUNTER — Other Ambulatory Visit: Payer: Self-pay

## 2020-06-19 ENCOUNTER — Ambulatory Visit
Admission: RE | Admit: 2020-06-19 | Discharge: 2020-06-19 | Disposition: A | Discharge status: Home / Self Care | Payer: Self-pay | Attending: Physician Assistant | Admitting: Physician Assistant

## 2020-06-19 ENCOUNTER — Other Ambulatory Visit: Payer: Self-pay | Admitting: Physician Assistant

## 2020-06-19 DIAGNOSIS — R1084 Generalized abdominal pain: Secondary | ICD-10-CM

## 2020-07-16 ENCOUNTER — Encounter: Payer: Self-pay | Admitting: Emergency Medicine

## 2020-07-16 ENCOUNTER — Other Ambulatory Visit: Payer: Self-pay

## 2020-07-16 ENCOUNTER — Emergency Department
Admission: EM | Admit: 2020-07-16 | Discharge: 2020-07-16 | Disposition: A | Discharge status: Home / Self Care | Payer: Self-pay | Attending: Emergency Medicine | Admitting: Emergency Medicine

## 2020-07-16 DIAGNOSIS — R531 Weakness: Secondary | ICD-10-CM | POA: Insufficient documentation

## 2020-07-16 DIAGNOSIS — R0981 Nasal congestion: Secondary | ICD-10-CM | POA: Insufficient documentation

## 2020-07-16 DIAGNOSIS — Z20822 Contact with and (suspected) exposure to covid-19: Secondary | ICD-10-CM | POA: Insufficient documentation

## 2020-07-16 DIAGNOSIS — J029 Acute pharyngitis, unspecified: Secondary | ICD-10-CM | POA: Insufficient documentation

## 2020-07-16 DIAGNOSIS — R6883 Chills (without fever): Secondary | ICD-10-CM | POA: Insufficient documentation

## 2020-07-16 DIAGNOSIS — J069 Acute upper respiratory infection, unspecified: Secondary | ICD-10-CM | POA: Insufficient documentation

## 2020-07-16 LAB — URINALYSIS, COMPLETE (UACMP) WITH MICROSCOPIC
Bacteria, UA: NONE SEEN
Bilirubin Urine: NEGATIVE
Glucose, UA: NEGATIVE mg/dL
Ketones, ur: NEGATIVE mg/dL
Leukocytes,Ua: NEGATIVE
Nitrite: NEGATIVE
Protein, ur: NEGATIVE mg/dL
Specific Gravity, Urine: 1.012 (ref 1.005–1.030)
pH: 5 (ref 5.0–8.0)

## 2020-07-16 LAB — CBC
HCT: 41.4 % (ref 36.0–46.0)
Hemoglobin: 14 g/dL (ref 12.0–15.0)
MCH: 29.4 pg (ref 26.0–34.0)
MCHC: 33.8 g/dL (ref 30.0–36.0)
MCV: 86.8 fL (ref 80.0–100.0)
Platelets: 237 10*3/uL (ref 150–400)
RBC: 4.77 MIL/uL (ref 3.87–5.11)
RDW: 13.4 % (ref 11.5–15.5)
WBC: 6.4 10*3/uL (ref 4.0–10.5)
nRBC: 0 % (ref 0.0–0.2)

## 2020-07-16 LAB — BASIC METABOLIC PANEL
Anion gap: 8 (ref 5–15)
BUN: 13 mg/dL (ref 8–23)
CO2: 25 mmol/L (ref 22–32)
Calcium: 9.4 mg/dL (ref 8.9–10.3)
Chloride: 107 mmol/L (ref 98–111)
Creatinine, Ser: 0.84 mg/dL (ref 0.44–1.00)
GFR calc Af Amer: >60 mL/min (ref >60)
GFR calc non Af Amer: >60 mL/min (ref >60)
Glucose, Bld: 132 mg/dL — ABNORMAL HIGH (ref 70–99)
Potassium: 3.7 mmol/L (ref 3.5–5.1)
Sodium: 140 mmol/L (ref 135–145)

## 2020-07-16 LAB — GROUP A STREP BY PCR: Group A Strep by PCR: NOT DETECTED

## 2020-07-16 LAB — SARS CORONAVIRUS 2 BY RT PCR (HOSPITAL ORDER, PERFORMED IN ~~LOC~~ HOSPITAL LAB): SARS Coronavirus 2: NEGATIVE

## 2020-07-16 MED ORDER — ONDANSETRON 4 MG PO TBDP
4.0000 mg | ORAL_TABLET | Freq: Once | ORAL | Status: AC | PRN
  Administered 2020-07-16: 4 mg via ORAL
  Filled 2020-07-16: qty 1

## 2020-07-16 MED ORDER — PSEUDOEPHEDRINE HCL 60 MG PO TABS
60.0000 mg | ORAL_TABLET | ORAL | 0 refills | Status: DC | PRN
Start: 2020-07-16 — End: 2021-06-08

## 2020-07-16 NOTE — ED Provider Notes (Signed)
[facility] Emergency Department Provider Note ____________________________________________   First MD Initiated Contact with Patient 07/16/20 [phone]     (approximate)  I have reviewed the triage vital signs and the nursing notes.   HISTORY  Chief Complaint Headache, Chills, Sore Throat, and Weakness    HPI Jillian Thomas is a 69 y.o. female with PMH as noted below including COVID-19 last year who presents with nasal congestion, sore throat, headache, nausea over the last day.  She has had no relief from TheraFlu.  She denies associated cough or shortness of breath.  The patient is concerned she could have Covid although denies any specific exposures.  Past Medical History:  Diagnosis Date  . COVID-19     Patient Active Problem List   Diagnosis Date Noted  . Palpitations 03/14/2016  . Obesity 03/14/2016    Past Surgical History:  Procedure Laterality Date  . CHOLECYSTECTOMY    . TUBAL LIGATION      Prior to Admission medications   Medication Sig Start Date End Date Taking? Authorizing Provider  benzonatate (TESSALON PERLES) 100 MG capsule Take 1-2 tabs TID prn cough 05/20/20   Menshew, Charlesetta Ivory, PA-C  cephALEXin (KEFLEX) 500 MG capsule Take 1 capsule (500 mg total) by mouth 3 (three) times daily. 05/29/20   Irean Hong, MD  chlorpheniramine-HYDROcodone [facility] PENNKINETIC ER) 10-8 MG/5ML SUER Take 5 mLs by mouth every 12 (twelve) hours as needed for cough. 11/02/19   Jene Every, MD  fluticasone (FLONASE) 50 MCG/ACT nasal spray Place 2 sprays into both nostrils daily. 05/20/20   Menshew, Charlesetta Ivory, PA-C  lidocaine (XYLOCAINE) 2 % solution Use as directed 5 mLs in the mouth or throat every 6 (six) hours as needed for mouth pain. Swish and slow swallow. 05/09/19   Joni Reining, PA-C  nitroGLYCERIN (NITROSTAT) 0.4 MG SL tablet Place 1 tablet (0.4 mg total) under the tongue every 5 (five) minutes as needed for chest pain. 02/05/16    Almond Lint, MD  ondansetron (ZOFRAN) 4 MG tablet Take 1 tablet (4 mg total) by mouth every 8 (eight) hours as needed. 06/01/16   Phineas Semen, MD  propranolol (INDERAL) 10 MG tablet Take 1 tablet (10 mg total) by mouth 2 (two) times daily. Patient not taking: Reported on 02/05/2016 01/15/16 01/29/16  Jennye Moccasin, MD  pseudoephedrine (SUDAFED) 60 MG tablet Take 1 tablet (60 mg total) by mouth every 4 (four) hours as needed for congestion. 07/16/20   [name], MD  albuterol (PROVENTIL HFA;VENTOLIN HFA) 108 (90 Base) MCG/ACT inhaler Inhale 2 puffs into the lungs every 6 (six) hours as needed for wheezing or shortness of breath. 05/30/16 05/20/20  Schaevitz, Myra Rude, MD  loratadine (CLARITIN) 10 MG tablet Take 1 tablet (10 mg total) by mouth daily. 04/18/16 05/20/20  Hagler, Jami L, PA-C    Allergies Patient has no known allergies.  No family history on file.  Social History Social History   Tobacco Use  . Smoking status: Never Smoker  . Smokeless tobacco: Never Used  Vaping Use  . Vaping Use: Never used  Substance Use Topics  . Alcohol use: No  . Drug use: No    Review of Systems  Constitutional: No fever/chills. Eyes: No visual changes. ENT: Positive for nasal congestion and sore throat. Cardiovascular: Denies chest pain. Respiratory: Denies shortness of breath. Gastrointestinal: Positive for nausea, no vomiting. Genitourinary: Negative for dysuria.  Musculoskeletal: Negative for back pain. Skin: Negative for rash. Neurological:  Positive for headache.   ____________________________________________   PHYSICAL EXAM:  VITAL SIGNS: ED Triage Vitals  Enc Vitals Group     BP 07/16/20 0625 (!) 131/93     Pulse Rate 07/16/20 0625 78     Resp 07/16/20 0625 16     Temp 07/16/20 0625 98.6 F (37 C)     Temp Source 07/16/20 0625 Oral     SpO2 07/16/20 0625 95 %     Weight 07/16/20 0625 190 lb (86.2 kg)     Height 07/16/20 0625 5\' 3"  (1.6 m)     Head  Circumference --      Peak Flow --      Pain Score 07/16/20 0630 5     Pain Loc --      Pain Edu? --      Excl. in GC? --     Constitutional: Alert and oriented. Well appearing and in no acute distress. Eyes: Conjunctivae are normal.  Head: Atraumatic. Nose: No congestion/rhinnorhea. Mouth/Throat: Mucous membranes are moist.  Oropharynx clear with no erythema or exudate. Neck: Normal range of motion.  No lymphadenopathy. Cardiovascular: Normal rate, regular rhythm. Grossly normal heart sounds.  Good peripheral circulation. Respiratory: Normal respiratory effort.  No retractions. Lungs CTAB. Gastrointestinal: No distention.  Musculoskeletal:  Extremities warm and well perfused.  Neurologic:  Normal speech and language. No gross focal neurologic deficits are appreciated.  Skin:  Skin is warm and dry. No rash noted. Psychiatric: Mood and affect are normal. Speech and behavior are normal.  ____________________________________________   LABS (all labs ordered are listed, but only abnormal results are displayed)  Labs Reviewed  BASIC METABOLIC PANEL - Abnormal; Notable for the following components:      Result Value   Glucose, Bld 132 (*)    All other components within normal limits  URINALYSIS, COMPLETE (UACMP) WITH MICROSCOPIC - Abnormal; Notable for the following components:   Color, Urine YELLOW (*)    APPearance HAZY (*)    Hgb urine dipstick MODERATE (*)    All other components within normal limits  SARS CORONAVIRUS 2 BY RT PCR (HOSPITAL ORDER, PERFORMED IN [facility])  GROUP A STREP BY PCR  CBC   ____________________________________________  EKG   ____________________________________________  RADIOLOGY    ____________________________________________   PROCEDURES  Procedure(s) performed: No  Procedures  Critical Care performed: No ____________________________________________   INITIAL IMPRESSION / ASSESSMENT AND PLAN / ED  COURSE  Pertinent labs & imaging results that were available during my care of the patient were reviewed by me and considered in my medical decision making (see chart for details).  69 year old female with PMH as noted above presents with URI symptoms over the last day.  She denies any chest pain, shortness of breath, or cough.  I reviewed the past medical records in Epic.  She was most recently seen in the ED about 6 weeks ago with lower abdominal pain and blurred vision.  Previously she was seen earlier in July with similar URI symptoms and discharged with steroid, Tessalon Perles, and Flonase.  She previously had COVID-19 last year.  On exam, the patient is overall well-appearing.  Her vital signs are normal.  The physical exam is unremarkable.  Oropharynx is clear.  Overall presentation is consistent with mild viral URI.  Basic labs obtained from triage are within normal limits.  There is no evidence of any pulmonary process.  We will obtain Covid and strep swabs to evaluate for other etiologies.  If  these are negative anticipate discharge home.  ----------------------------------------- 11:44 AM on 07/16/2020 -----------------------------------------  Covid and strep are negative.  The patient continues to appear comfortable.  She is stable for discharge home.  Return precautions given, and she expresses understanding.  ____________________________________________   FINAL CLINICAL IMPRESSION(S) / ED DIAGNOSES  Final diagnoses:  Upper respiratory tract infection, unspecified type      NEW MEDICATIONS STARTED DURING THIS VISIT:  New Prescriptions   PSEUDOEPHEDRINE (SUDAFED) 60 MG TABLET    Take 1 tablet (60 mg total) by mouth every 4 (four) hours as needed for congestion.     Note:  This document was prepared using Dragon voice recognition software and may include unintentional dictation errors.   [name], MD 07/16/20 1144

## 2020-07-16 NOTE — ED Triage Notes (Addendum)
Pt arrived via POV with reports of nausea, chills, headache and sore throat. Pt states the sxs got worse during the night.  Pt states she has been taking theraflu because she thought she had a cold. No tylenol or ibuprofen taken.   PT also c/o weakness

## 2020-07-16 NOTE — Discharge Instructions (Signed)
You may take the prescribed Sudafed as needed for nasal congestion.  Return to the ER for new, worsening, or persistent severe headache, nausea or vomiting, fever, weakness, or any other new or worsening symptoms that concern you.

## 2020-07-31 ENCOUNTER — Other Ambulatory Visit: Payer: Self-pay

## 2020-07-31 DIAGNOSIS — Z20822 Contact with and (suspected) exposure to covid-19: Secondary | ICD-10-CM

## 2020-08-02 LAB — SPECIMEN STATUS REPORT

## 2020-08-02 LAB — NOVEL CORONAVIRUS, NAA: SARS-CoV-2, NAA: NOT DETECTED

## 2020-08-02 LAB — SARS-COV-2, NAA 2 DAY TAT

## 2020-08-07 ENCOUNTER — Encounter: Payer: Self-pay | Admitting: *Deleted

## 2020-08-07 ENCOUNTER — Emergency Department: Payer: Self-pay

## 2020-08-07 ENCOUNTER — Other Ambulatory Visit: Payer: Self-pay

## 2020-08-07 DIAGNOSIS — M549 Dorsalgia, unspecified: Secondary | ICD-10-CM | POA: Insufficient documentation

## 2020-08-07 DIAGNOSIS — R1030 Lower abdominal pain, unspecified: Secondary | ICD-10-CM | POA: Insufficient documentation

## 2020-08-07 DIAGNOSIS — Z5321 Procedure and treatment not carried out due to patient leaving prior to being seen by health care provider: Secondary | ICD-10-CM | POA: Insufficient documentation

## 2020-08-07 LAB — CBC
HCT: 39 % (ref 36.0–46.0)
Hemoglobin: 14 g/dL (ref 12.0–15.0)
MCH: 30.2 pg (ref 26.0–34.0)
MCHC: 35.9 g/dL (ref 30.0–36.0)
MCV: 84.2 fL (ref 80.0–100.0)
Platelets: 242 10*3/uL (ref 150–400)
RBC: 4.63 MIL/uL (ref 3.87–5.11)
RDW: 13.5 % (ref 11.5–15.5)
WBC: 6.6 10*3/uL (ref 4.0–10.5)
nRBC: 0 % (ref 0.0–0.2)

## 2020-08-07 LAB — URINALYSIS, COMPLETE (UACMP) WITH MICROSCOPIC
Bacteria, UA: NONE SEEN
Bilirubin Urine: NEGATIVE
Glucose, UA: NEGATIVE mg/dL
Hgb urine dipstick: NEGATIVE
Ketones, ur: 5 mg/dL — AB
Leukocytes,Ua: NEGATIVE
Nitrite: NEGATIVE
Protein, ur: NEGATIVE mg/dL
Specific Gravity, Urine: 1.012 (ref 1.005–1.030)
pH: 6 (ref 5.0–8.0)

## 2020-08-07 LAB — COMPREHENSIVE METABOLIC PANEL WITH GFR
ALT: 15 U/L (ref 0–44)
AST: 17 U/L (ref 15–41)
Albumin: 4.2 g/dL (ref 3.5–5.0)
Alkaline Phosphatase: 52 U/L (ref 38–126)
Anion gap: 9 (ref 5–15)
BUN: 10 mg/dL (ref 8–23)
CO2: 25 mmol/L (ref 22–32)
Calcium: 9.3 mg/dL (ref 8.9–10.3)
Chloride: 106 mmol/L (ref 98–111)
Creatinine, Ser: 0.73 mg/dL (ref 0.44–1.00)
GFR calc Af Amer: >60 mL/min
GFR calc non Af Amer: >60 mL/min
Glucose, Bld: 109 mg/dL — ABNORMAL HIGH (ref 70–99)
Potassium: 3.7 mmol/L (ref 3.5–5.1)
Sodium: 140 mmol/L (ref 135–145)
Total Bilirubin: 1 mg/dL (ref 0.3–1.2)
Total Protein: 7.3 g/dL (ref 6.5–8.1)

## 2020-08-07 LAB — LIPASE, BLOOD: Lipase: 31 U/L (ref 11–51)

## 2020-08-07 LAB — TROPONIN I (HIGH SENSITIVITY): Troponin I (High Sensitivity): 4 ng/L (ref <18)

## 2020-08-07 NOTE — ED Triage Notes (Signed)
Pt has low abd pain and upper back pain.  Sx for 1 week.  No n/v/d  No cough  No fever  Pt alert speech clear.

## 2020-08-08 ENCOUNTER — Emergency Department
Admission: EM | Admit: 2020-08-08 | Discharge: 2020-08-08 | Disposition: A | Discharge status: Home / Self Care | Payer: Self-pay | Attending: Emergency Medicine | Admitting: Emergency Medicine

## 2020-08-08 ENCOUNTER — Other Ambulatory Visit: Payer: Self-pay

## 2020-08-08 DIAGNOSIS — Z20822 Contact with and (suspected) exposure to covid-19: Secondary | ICD-10-CM

## 2020-08-08 NOTE — ED Notes (Signed)
No answer when called several times from lobby 

## 2020-08-09 LAB — NOVEL CORONAVIRUS, NAA: SARS-CoV-2, NAA: NOT DETECTED

## 2020-08-09 LAB — SARS-COV-2, NAA 2 DAY TAT

## 2020-08-30 ENCOUNTER — Other Ambulatory Visit: Payer: Self-pay

## 2020-08-30 DIAGNOSIS — Z20822 Contact with and (suspected) exposure to covid-19: Secondary | ICD-10-CM

## 2020-08-31 LAB — SARS-COV-2, NAA 2 DAY TAT

## 2020-08-31 LAB — NOVEL CORONAVIRUS, NAA: SARS-CoV-2, NAA: NOT DETECTED

## 2020-09-26 ENCOUNTER — Other Ambulatory Visit: Payer: Self-pay

## 2020-10-10 ENCOUNTER — Other Ambulatory Visit: Payer: Self-pay

## 2020-10-12 ENCOUNTER — Other Ambulatory Visit: Payer: Self-pay

## 2020-10-24 ENCOUNTER — Other Ambulatory Visit: Payer: Self-pay

## 2020-10-24 DIAGNOSIS — Z20822 Contact with and (suspected) exposure to covid-19: Secondary | ICD-10-CM

## 2020-10-26 LAB — SARS-COV-2, NAA 2 DAY TAT

## 2020-10-26 LAB — NOVEL CORONAVIRUS, NAA: SARS-CoV-2, NAA: NOT DETECTED

## 2020-11-07 ENCOUNTER — Other Ambulatory Visit: Payer: Self-pay

## 2020-11-07 DIAGNOSIS — Z20822 Contact with and (suspected) exposure to covid-19: Secondary | ICD-10-CM

## 2020-11-08 LAB — SARS-COV-2, NAA 2 DAY TAT

## 2020-11-08 LAB — NOVEL CORONAVIRUS, NAA: SARS-CoV-2, NAA: NOT DETECTED

## 2020-11-12 ENCOUNTER — Encounter: Payer: Self-pay | Admitting: Emergency Medicine

## 2020-11-12 ENCOUNTER — Other Ambulatory Visit: Payer: Self-pay

## 2020-11-12 ENCOUNTER — Emergency Department: Payer: Self-pay

## 2020-11-12 ENCOUNTER — Emergency Department
Admission: EM | Admit: 2020-11-12 | Discharge: 2020-11-12 | Disposition: A | Discharge status: Home / Self Care | Payer: Self-pay | Attending: Emergency Medicine | Admitting: Emergency Medicine

## 2020-11-12 DIAGNOSIS — B349 Viral infection, unspecified: Secondary | ICD-10-CM | POA: Insufficient documentation

## 2020-11-12 DIAGNOSIS — Z20822 Contact with and (suspected) exposure to covid-19: Secondary | ICD-10-CM | POA: Insufficient documentation

## 2020-11-12 DIAGNOSIS — Z8616 Personal history of COVID-19: Secondary | ICD-10-CM | POA: Insufficient documentation

## 2020-11-12 MED ORDER — PSEUDOEPH-BROMPHEN-DM 30-2-10 MG/5ML PO SYRP: 10.0000 mL | ORAL_SOLUTION | Freq: Four times a day (QID) | ORAL | 0 refills | Status: DC | PRN

## 2020-11-12 NOTE — ED Provider Notes (Signed)
[facility] Emergency Department Provider Note  ____________________________________________  Time seen: Approximately 3:26 PM  I have reviewed the triage vital signs and the nursing notes.   HISTORY  Chief Complaint Cough   HPI Jillian Thomas is a 70 y.o. female presenting to the emergency department for treatment and evaluation of cough. Symptoms started 3-4 days ago. She is currently on Amoxicillin after developing a sore throat. Her granddaughter is Strep positive. Cough started after starting Amoxicillin. Productive of white sputum. No known fever.   Past Medical History:  Diagnosis Date  . COVID-19     Patient Active Problem List   Diagnosis Date Noted  . Palpitations 03/14/2016  . Obesity 03/14/2016    Past Surgical History:  Procedure Laterality Date  . CHOLECYSTECTOMY    . TUBAL LIGATION      Prior to Admission medications   Medication Sig Start Date End Date Taking? Authorizing Provider  brompheniramine-pseudoephedrine-DM 30-2-10 MG/5ML syrup Take 10 mLs by mouth 4 (four) times daily as needed. 11/12/20  Yes Mrytle Bento B, FNP  cephALEXin (KEFLEX) 500 MG capsule Take 1 capsule (500 mg total) by mouth 3 (three) times daily. 05/29/20   Irean Hong, MD  fluticasone (FLONASE) 50 MCG/ACT nasal spray Place 2 sprays into both nostrils daily. 05/20/20   Menshew, Charlesetta Ivory, PA-C  lidocaine (XYLOCAINE) 2 % solution Use as directed 5 mLs in the mouth or throat every 6 (six) hours as needed for mouth pain. Swish and slow swallow. 05/09/19   Joni Reining, PA-C  nitroGLYCERIN (NITROSTAT) 0.4 MG SL tablet Place 1 tablet (0.4 mg total) under the tongue every 5 (five) minutes as needed for chest pain. 02/05/16   Almond Lint, MD  ondansetron (ZOFRAN) 4 MG tablet Take 1 tablet (4 mg total) by mouth every 8 (eight) hours as needed. 06/01/16   Phineas Semen, MD  propranolol (INDERAL) 10 MG tablet Take 1 tablet (10 mg total) by mouth 2 (two) times  daily. Patient not taking: Reported on 02/05/2016 01/15/16 01/29/16  Jennye Moccasin, MD  pseudoephedrine (SUDAFED) 60 MG tablet Take 1 tablet (60 mg total) by mouth every 4 (four) hours as needed for congestion. 07/16/20   [name], MD  albuterol (PROVENTIL HFA;VENTOLIN HFA) 108 (90 Base) MCG/ACT inhaler Inhale 2 puffs into the lungs every 6 (six) hours as needed for wheezing or shortness of breath. 05/30/16 05/20/20  Schaevitz, Myra Rude, MD  loratadine (CLARITIN) 10 MG tablet Take 1 tablet (10 mg total) by mouth daily. 04/18/16 05/20/20  Hagler, Jami L, PA-C    Allergies Patient has no known allergies.  History reviewed. No pertinent family history.  Social History Social History   Tobacco Use  . Smoking status: Never Smoker  . Smokeless tobacco: Never Used  Vaping Use  . Vaping Use: Never used  Substance Use Topics  . Alcohol use: No  . Drug use: No    Review of Systems Constitutional: Negative fever/chills. Normal appetite. ENT: No sore throat. Cardiovascular: Denies chest pain. Respiratory: no shortness of breath. Positive for cough. Negative wheezing.  Gastrointestinal: Negative for nausea,  no vomiting.  no diarrhea.  Musculoskeletal: Negative for body aches Skin: Negative for rash. Neurological: Negative for headaches ____________________________________________   PHYSICAL EXAM:  VITAL SIGNS: ED Triage Vitals  Enc Vitals Group     BP 11/12/20 1443 (!) 144/89     Pulse Rate 11/12/20 1443 (!) 102     Resp 11/12/20 1443 18  Temp 11/12/20 1443 99.1 F (37.3 C)     Temp src --      SpO2 11/12/20 1443 94 %     Weight 11/12/20 1446 190 lb (86.2 kg)     Height 11/12/20 1446 5\' 3"  (1.6 m)     Head Circumference --      Peak Flow --      Pain Score 11/12/20 1446 0     Pain Loc --      Pain Edu? --      Excl. in [place]? --     Constitutional: Alert and oriented. Well appearing and in no acute distress. Eyes: Conjunctivae are normal. Ears: TM  normal Nose: no sinus congestion noted; no rhinnorhea. Mouth/Throat: Mucous membranes are moist.  Oropharynx normal. Tonsils not visualized. Uvula midline. Neck: No stridor.  Lymphatic: No cervical lymphadenopathy. Cardiovascular: Normal rate, regular rhythm. Good peripheral circulation. Respiratory: Respirations are even and unlabored.  No retractions. Breath sounds clear to auscultation. Gastrointestinal: Soft and nontender.  Musculoskeletal: FROM x 4 extremities.  Neurologic:  Normal speech and language. Skin:  Skin is warm, dry and intact. No rash noted. Psychiatric: Mood and affect are normal. Speech and behavior are normal.  ____________________________________________   LABS (all labs ordered are listed, but only abnormal results are displayed)  Labs Reviewed  SARS CORONAVIRUS 2 (TAT 6-24 HRS)   ____________________________________________  EKG  Not indicated. ____________________________________________  RADIOLOGY  Chest x-ray negative for acute cardiopulmonary abnormalities.  ____________________________________________   PROCEDURES  Procedure(s) performed: None  Critical Care performed: No ____________________________________________   INITIAL IMPRESSION / ASSESSMENT AND PLAN / ED COURSE  70 y.o. female presents to the emergency room for evaluation of cough. See HPI. She states that her throat is better today, but cough is preventing her from being able to sleep. She had a negative COVID test last week. No known exposure.   Plan will be to send a PCR test and prescribe Bromfed since her chest x-ray is normal. She will continue the Amoxicillin as prescribed. She is to follow up with primary care or return to the ER for symptoms that change or worsen.    Medications - No data to display  ED Discharge Orders         Ordered    brompheniramine-pseudoephedrine-DM 30-2-10 MG/5ML syrup  4 times daily PRN        11/12/20 1542           Pertinent labs &  imaging results that were available during my care of the patient were reviewed by me and considered in my medical decision making (see chart for details).    If controlled substance prescribed during this visit, 12 month history viewed on the [facility] prior to issuing an initial prescription for Schedule II or III opiod. ____________________________________________   FINAL CLINICAL IMPRESSION(S) / ED DIAGNOSES  Final diagnoses:  Acute viral syndrome    Note:  This document was prepared using Dragon voice recognition software and may include unintentional dictation errors.    01/10/21, FNP 11/12/20 1658    01/10/21, MD 11/12/20 2040

## 2020-11-12 NOTE — ED Triage Notes (Signed)
Pt ambulatory to triage with c/o cough for 3-4 days.  Pt states cough is productive of white sputum.  States is taking abx for possible strep throat.  States cough started after abx were started.  States COVID test on Wednesday was negative.

## 2020-11-12 NOTE — Discharge Instructions (Signed)
Follow up with primary care for symptoms that are not improving over the week. ° °Return to the ER for symptoms that change or worsen if unable to schedule an appointment. °

## 2020-11-13 LAB — SARS CORONAVIRUS 2 (TAT 6-24 HRS): SARS Coronavirus 2: NEGATIVE

## 2020-11-21 ENCOUNTER — Other Ambulatory Visit: Payer: Self-pay

## 2020-11-21 ENCOUNTER — Encounter: Payer: Self-pay | Admitting: Emergency Medicine

## 2020-11-21 DIAGNOSIS — Z20822 Contact with and (suspected) exposure to covid-19: Secondary | ICD-10-CM

## 2020-11-23 LAB — NOVEL CORONAVIRUS, NAA: SARS-CoV-2, NAA: NOT DETECTED

## 2020-11-23 LAB — SARS-COV-2, NAA 2 DAY TAT

## 2020-12-03 ENCOUNTER — Other Ambulatory Visit: Payer: Self-pay

## 2021-06-08 ENCOUNTER — Other Ambulatory Visit: Payer: Self-pay

## 2021-06-08 ENCOUNTER — Encounter: Payer: Self-pay | Admitting: Emergency Medicine

## 2021-06-08 ENCOUNTER — Emergency Department
Admission: EM | Admit: 2021-06-08 | Discharge: 2021-06-08 | Disposition: A | Discharge status: Home / Self Care | Payer: Self-pay | Attending: Emergency Medicine | Admitting: Emergency Medicine

## 2021-06-08 DIAGNOSIS — N390 Urinary tract infection, site not specified: Secondary | ICD-10-CM

## 2021-06-08 DIAGNOSIS — Z8616 Personal history of COVID-19: Secondary | ICD-10-CM | POA: Insufficient documentation

## 2021-06-08 LAB — URINALYSIS, COMPLETE (UACMP) WITH MICROSCOPIC
Bacteria, UA: NONE SEEN
Bilirubin Urine: NEGATIVE
Glucose, UA: NEGATIVE mg/dL
Ketones, ur: NEGATIVE mg/dL
Nitrite: NEGATIVE
Protein, ur: NEGATIVE mg/dL
Specific Gravity, Urine: 1.013 (ref 1.005–1.030)
pH: 5 (ref 5.0–8.0)

## 2021-06-08 MED ORDER — PHENAZOPYRIDINE HCL 100 MG PO TABS: 100.0000 mg | ORAL_TABLET | Freq: Three times a day (TID) | ORAL | 0 refills | Status: DC | PRN

## 2021-06-08 MED ORDER — CEPHALEXIN 500 MG PO CAPS: 500.0000 mg | ORAL_CAPSULE | Freq: Three times a day (TID) | ORAL | 0 refills | Status: DC

## 2021-06-08 NOTE — ED Provider Notes (Signed)
[facility] Emergency Department Provider Note  ____________________________________________   None    (approximate)  I have reviewed the triage vital signs and the nursing notes.   HISTORY  Chief Complaint Pelvic Pain   HPI Jillian Thomas is a 70 y.o. female presents to the ED with complaint of urinary frequency and burning.  Patient states she has had urinary tract infections in the past but not within the last year.  She also reports that she does not have a history of hypertension and recently had a physical in which it was normal.  She denies any other symptoms other than just discomfort.  She denies nausea, vomiting, fever or chills.  Rates her pain as an 8 out of 10.         Past Medical History:  Diagnosis Date   COVID-19     Patient Active Problem List   Diagnosis Date Noted   Palpitations 03/14/2016   Obesity 03/14/2016    Past Surgical History:  Procedure Laterality Date   CHOLECYSTECTOMY     TUBAL LIGATION      Prior to Admission medications   Medication Sig Start Date End Date Taking? Authorizing Provider  phenazopyridine (PYRIDIUM) 100 MG tablet Take 1 tablet (100 mg total) by mouth 3 (three) times daily as needed for pain. 06/08/21  Yes Tommi Rumps, PA-C  cephALEXin (KEFLEX) 500 MG capsule Take 1 capsule (500 mg total) by mouth 3 (three) times daily. 06/08/21   Tommi Rumps, PA-C  nitroGLYCERIN (NITROSTAT) 0.4 MG SL tablet Place 1 tablet (0.4 mg total) under the tongue every 5 (five) minutes as needed for chest pain. 02/05/16   Almond Lint, MD  propranolol (INDERAL) 10 MG tablet Take 1 tablet (10 mg total) by mouth 2 (two) times daily. Patient not taking: Reported on 02/05/2016 01/15/16 01/29/16  Jennye Moccasin, MD  albuterol (PROVENTIL HFA;VENTOLIN HFA) 108 [phone] Base) MCG/ACT inhaler Inhale 2 puffs into the lungs every 6 (six) hours as needed for wheezing or shortness of breath. 05/30/16 05/20/20  Schaevitz, Myra Rude, MD  loratadine (CLARITIN) 10 MG tablet Take 1 tablet (10 mg total) by mouth daily. 04/18/16 05/20/20  Hagler, Jami L, PA-C    Allergies Patient has no known allergies.  History reviewed. No pertinent family history.  Social History Social History   Tobacco Use   Smoking status: Never   Smokeless tobacco: Never  Vaping Use   Vaping Use: Never used  Substance Use Topics   Alcohol use: No   Drug use: No    Review of Systems  Constitutional: No fever/chills Eyes: No visual changes. Cardiovascular: Denies chest pain. Respiratory: Denies shortness of breath. Gastrointestinal: No abdominal pain.  No nausea, no vomiting.   Genitourinary: Positive dysuria. Musculoskeletal: Negative for musculoskeletal pain. Skin: Negative for rash. Neurological: Negative for headaches, focal weakness or numbness.  ____________________________________________   PHYSICAL EXAM:  VITAL SIGNS: ED Triage Vitals  Enc Vitals Group     BP 06/08/21 0639 (!) 149/102     Pulse Rate 06/08/21 0639 71     Resp 06/08/21 0639 16     Temp 06/08/21 0639 98 F (36.7 C)     Temp Source 06/08/21 0639 Oral     SpO2 06/08/21 0639 100 %     Weight 06/08/21 0640 180 lb (81.6 kg)     Height 06/08/21 0640 5\' 3"  (1.6 m)     Head Circumference --      Peak Flow --  Pain Score 06/08/21 0640 8     Pain Loc --      Pain Edu? --      Excl. in GC? --     Constitutional: Alert and oriented. Well appearing and in no acute distress. Eyes: Conjunctivae are normal.  Head: Atraumatic. Nose: No congestion/rhinnorhea. Cardiovascular: Normal rate, regular rhythm. Grossly normal heart sounds.  Good peripheral circulation. Respiratory: Normal respiratory effort.  No retractions. Lungs CTAB. Gastrointestinal: Soft and nontender. No distention.  No CVA tenderness. Musculoskeletal: No lower extremity tenderness nor edema.  No joint effusions. Neurologic:  Normal speech and language. No gross focal neurologic  deficits are appreciated. No gait instability. Skin:  Skin is warm, dry and intact. No rash noted. Psychiatric: Mood and affect are normal. Speech and behavior are normal.  ____________________________________________   LABS (all labs ordered are listed, but only abnormal results are displayed)  Labs Reviewed  URINALYSIS, COMPLETE (UACMP) WITH MICROSCOPIC - Abnormal; Notable for the following components:      Result Value   Color, Urine YELLOW (*)    APPearance HAZY (*)    Hgb urine dipstick SMALL (*)    Leukocytes,Ua LARGE (*)    All other components within normal limits  URINE CULTURE    PROCEDURES  Procedure(s) performed (including Critical Care):  Procedures   ____________________________________________   INITIAL IMPRESSION / ASSESSMENT AND PLAN / ED COURSE  As part of my medical decision making, I reviewed the following data within the electronic MEDICAL RECORD NUMBER Notes from prior ED visits and [place] Controlled Substance Database  71 year old female presents to the ED with complaint of dysuria with history of previous UTIs in the past.  Urinalysis was consistent with a UTI.  There is no CVA tenderness, fever or chills.  Patient's blood pressure was rechecked and was 134/74 prior to discharge.  Patient was given prescription for Keflex 500 mg 3 times daily and Pyridium 3 times daily as needed for dysuria.  She is to follow-up with her PCP if any continued problems and she is encouraged to drink fluids frequently.  ____________________________________________   FINAL CLINICAL IMPRESSION(S) / ED DIAGNOSES  Final diagnoses:  Acute UTI     ED Discharge Orders          Ordered    cephALEXin (KEFLEX) 500 MG capsule  3 times daily        06/08/21 0743    phenazopyridine (PYRIDIUM) 100 MG tablet  3 times daily PRN        06/08/21 0743             Note:  This document was prepared using Dragon voice recognition software and may include unintentional dictation  errors.    Tommi Rumps, PA-C 06/08/21 1245    Jene Every, MD 06/08/21 [phone]

## 2021-06-08 NOTE — ED Triage Notes (Signed)
Pt complains of pelvic pain that radiates to back. Pt complains of urinary frequency and burning. Denies fever, vomiting or known hematuria.

## 2021-06-08 NOTE — Discharge Instructions (Addendum)
Follow-up with your primary care provider if any continued problems or concerns.  Your second blood pressure was much better than the first blood pressure that was taken and is completely normal.  Increase fluids.  A prescription for an antibiotic and also a medication that will turn your urine a bright yellow or orange color.  This medication is to help with burning along with frequency.  This is temporary.  You may take Tylenol or ibuprofen as needed for pain.  Return to the emergency department if any severe worsening of your symptoms such as fever, chills, nausea or vomiting.

## 2021-06-09 LAB — URINE CULTURE

## 2021-06-10 ENCOUNTER — Other Ambulatory Visit: Payer: Self-pay

## 2021-06-10 ENCOUNTER — Ambulatory Visit
Admission: RE | Admit: 2021-06-10 | Discharge: 2021-06-10 | Disposition: A | Discharge status: Home / Self Care | Payer: Self-pay | Source: Ambulatory Visit | Attending: Physician Assistant | Admitting: Physician Assistant

## 2021-06-10 ENCOUNTER — Other Ambulatory Visit: Payer: Self-pay | Admitting: Physician Assistant

## 2021-06-10 DIAGNOSIS — R109 Unspecified abdominal pain: Secondary | ICD-10-CM | POA: Insufficient documentation

## 2021-11-13 ENCOUNTER — Emergency Department
Admission: EM | Admit: 2021-11-13 | Discharge: 2021-11-13 | Disposition: A | Discharge status: Home / Self Care | Payer: Self-pay | Attending: Emergency Medicine | Admitting: Emergency Medicine

## 2021-11-13 ENCOUNTER — Other Ambulatory Visit: Payer: Self-pay

## 2021-11-13 ENCOUNTER — Encounter: Payer: Self-pay | Admitting: Emergency Medicine

## 2021-11-13 DIAGNOSIS — U071 COVID-19: Secondary | ICD-10-CM | POA: Insufficient documentation

## 2021-11-13 LAB — RESP PANEL BY RT-PCR (FLU A&B, COVID) ARPGX2
Influenza A by PCR: NEGATIVE
Influenza B by PCR: NEGATIVE
SARS Coronavirus 2 by RT PCR: POSITIVE — AB

## 2021-11-13 MED ORDER — DICYCLOMINE HCL 10 MG PO CAPS: 10.0000 mg | ORAL_CAPSULE | Freq: Three times a day (TID) | ORAL | 0 refills | Status: DC

## 2021-11-13 MED ORDER — ONDANSETRON 4 MG PO TBDP: 4.0000 mg | ORAL_TABLET | Freq: Three times a day (TID) | ORAL | 0 refills | Status: AC | PRN

## 2021-11-13 MED ORDER — PROMETHAZINE HCL 12.5 MG PO TABS: 12.5000 mg | ORAL_TABLET | Freq: Four times a day (QID) | ORAL | 0 refills | Status: DC | PRN

## 2021-11-13 NOTE — ED Triage Notes (Signed)
Pt comes into the ED via POV c/o emesis and that started last night as well as fatigue.  Pt's husband currently has COVID but her home test was negative.  Pt ambulatory to triage at this time and has even and unlabored respirations.

## 2021-11-13 NOTE — ED Provider Notes (Signed)
[facility] Provider Note  Patient Contact: 6:01 PM (approximate)   History   Emesis   HPI  Jillian Thomas is a 71 y.o. female with an unremarkable past medical history presents to the emergency department with emesis that started last night.  Patient has been tested positive recently for COVID-19.  She has had some nasal congestion and cough.  Denies diarrhea.  No chest pain, chest tightness or shortness of breath.  States that she takes no medications daily aside from multivitamins.      Physical Exam   Triage Vital Signs: ED Triage Vitals  Enc Vitals Group     BP 11/13/21 1508 136/89     Pulse Rate 11/13/21 1508 (!) 114     Resp 11/13/21 1508 17     Temp 11/13/21 1508 99.8 F (37.7 C)     Temp Source 11/13/21 1508 Oral     SpO2 11/13/21 1508 90 %     Weight 11/13/21 1452 179 lb 14.3 oz (81.6 kg)     Height 11/13/21 1452 5\' 3"  (1.6 m)     Head Circumference --      Peak Flow --      Pain Score 11/13/21 1452 7     Pain Loc --      Pain Edu? --      Excl. in [place]? --     Most recent vital signs: Vitals:   11/13/21 1508  BP: 136/89  Pulse: (!) 114  Resp: 17  Temp: 99.8 F (37.7 C)  SpO2: 90%     Constitutional: Alert and oriented. Patient is lying supine. Eyes: Conjunctivae are normal. PERRL. EOMI. Head: Atraumatic. ENT:      Ears: Tympanic membranes are mildly injected with mild effusion bilaterally.       Nose: No congestion/rhinnorhea.      Mouth/Throat: Mucous membranes are moist. Posterior pharynx is mildly erythematous.  Hematological/Lymphatic/Immunilogical: No cervical lymphadenopathy.  Cardiovascular: Normal rate, regular rhythm. Normal S1 and S2.  Good peripheral circulation. Respiratory: Normal respiratory effort without tachypnea or retractions. Lungs CTAB. Good air entry to the bases with no decreased or absent breath sounds. Gastrointestinal: Bowel sounds 4 quadrants. Soft and nontender to palpation. No guarding or  rigidity. No palpable masses. No distention. No CVA tenderness. Musculoskeletal: Full range of motion to all extremities. No gross deformities appreciated. Neurologic:  Normal speech and language. No gross focal neurologic deficits are appreciated.  Skin:  Skin is warm, dry and intact. No rash noted. Psychiatric: Mood and affect are normal. Speech and behavior are normal. Patient exhibits appropriate insight and judgement.   ED Results / Procedures / Treatments   Labs (all labs ordered are listed, but only abnormal results are displayed) Labs Reviewed  RESP PANEL BY RT-PCR (FLU A&B, COVID) ARPGX2 - Abnormal; Notable for the following components:      Result Value   SARS Coronavirus 2 by RT PCR POSITIVE (*)    All other components within normal limits       MEDICATIONS ORDERED IN ED: Medications - No data to display   IMPRESSION / MDM / [place] / ED COURSE  I reviewed the triage vital signs and the nursing notes.                              Assessment and Plan: Covid 19:  Differential diagnosis includes, but is not limited to, COVID-19, influenza, electrolyte abnormality,  enteritis...  71 year old female with an unremarkable past medical history presents to the emergency department with emesis secondary to COVID-19.  Patient was mildly tachycardic at triage but vital signs were otherwise reassuring.  Patient was able to ambulate easily from the waiting room to triage extension.   Patient stated that she would like to try a trial of antiemetics at home I would return to the emergency department if her symptoms seem to be worsening.  She was discharged with Phenergan and Zofran as well as Bentyl.  Return precautions were given to return with new or worsening symptoms.  All patient questions were answered.      FINAL CLINICAL IMPRESSION(S) / ED DIAGNOSES   Final diagnoses:  COVID-19     Rx / DC Orders   ED Discharge Orders          Ordered     ondansetron (ZOFRAN-ODT) 4 MG disintegrating tablet  Every 8 hours PRN        11/13/21 1754    promethazine (PHENERGAN) 12.5 MG tablet  Every 6 hours PRN        11/13/21 1756    dicyclomine (BENTYL) 10 MG capsule  3 times daily before meals & bedtime        11/13/21 1757             Note:  This document was prepared using Dragon voice recognition software and may include unintentional dictation errors.   Vallarie Mare [place], PA-C 11/13/21 Evalee Jefferson    Vladimir Crofts, MD 11/13/21 [phone]

## 2021-11-16 ENCOUNTER — Emergency Department
Admission: EM | Admit: 2021-11-16 | Discharge: 2021-11-16 | Disposition: A | Discharge status: Home / Self Care | Payer: Self-pay | Attending: Emergency Medicine | Admitting: Emergency Medicine

## 2021-11-16 ENCOUNTER — Other Ambulatory Visit: Payer: Self-pay

## 2021-11-16 ENCOUNTER — Emergency Department: Payer: Self-pay

## 2021-11-16 DIAGNOSIS — R0602 Shortness of breath: Secondary | ICD-10-CM

## 2021-11-16 DIAGNOSIS — U071 COVID-19: Secondary | ICD-10-CM | POA: Insufficient documentation

## 2021-11-16 NOTE — ED Provider Notes (Signed)
[facility] Provider Note    Event Date/Time   First MD Initiated Contact with Patient 11/16/21 1045     (approximate)   History   Shortness of Breath   HPI Jillian Thomas is a 71 y.o. female with a stated past medical history of intermittent palpitations who presents for shortness of breath in the setting of COVID-19 infection.  Patient states that while she was sleeping last night she got alert that her pulse ox dropped to 91% and encouraged her to come to the emergency department.  Patient states that this shortness of breath is worsened with ambulation and pressure relieved at rest.  Patient has not tried any medications for this shortness of breath.  Patient states she has no history of bleeding or clotting disorders.  Patient is not on any anticoagulation medicine.  Patient denies any history of DVT/PE.     Physical Exam   Triage Vital Signs: ED Triage Vitals  Enc Vitals Group     BP 11/16/21 0457 119/82     Pulse Rate 11/16/21 0457 89     Resp 11/16/21 0457 20     Temp 11/16/21 0457 99 F (37.2 C)     Temp Source 11/16/21 0457 Oral     SpO2 11/16/21 0457 94 %     Weight 11/16/21 0458 182 lb (82.6 kg)     Height 11/16/21 0458 5\' 3"  (1.6 m)     Head Circumference --      Peak Flow --      Pain Score 11/16/21 1100 0     Pain Loc --      Pain Edu? --      Excl. in GC? --     Most recent vital signs: Vitals:   11/16/21 0457 11/16/21 1100  BP: 119/82 122/80  Pulse: 89 90  Resp: 20 18  Temp: 99 F (37.2 C) 99 F (37.2 C)  SpO2: 94% 95%    General: Awake, no distress.  CV:  Good peripheral perfusion.  Resp:  Normal effort.  CTAB Abd:  No distention.  Other:  Overweight elderly Caucasian female laying in bed   ED Results / Procedures / Treatments   Labs (all labs ordered are listed, but only abnormal results are displayed) Labs Reviewed - No data to display  RADIOLOGY ED MD interpretation: 2 view chest x-ray shows no evidence  of acute abnormalities including no pneumonia, pneumothorax, or widened mediastinum.  Agree with radiology interpretation  Official radiology report(s): DG Chest 2 View  Result Date: 11/16/2021 CLINICAL DATA:  Shortness of breath.  COVID positive. EXAM: CHEST - 2 VIEW COMPARISON:  11/12/2020 FINDINGS: The lungs are clear without focal pneumonia, edema, pneumothorax or pleural effusion. The cardiopericardial silhouette is within normal limits for size. The visualized bony structures of the thorax show no acute abnormality. IMPRESSION: No active cardiopulmonary disease. Electronically Signed   By: 01/10/2021 M.D.   On: 11/16/2021 05:39      PROCEDURES:  Critical Care performed: No  Procedures   MEDICATIONS ORDERED IN ED: Medications - No data to display   IMPRESSION / MDM / ASSESSMENT AND PLAN / ED COURSE  I reviewed the triage vital signs and the nursing notes.                              Presentation most consistent with Viral Syndrome.  Patient has tested positive for COVID-19. Based on  vitals and exam they are nontoxic and stable for discharge.  Given History and Exam I have a lower suspicion for: Emergent CardioPulmonary causes [such as Acute Asthma or COPD Exacerbation, acute Heart Failure or exacerbation, PE, PTX, atypical ACS, PNA]. Emergent Otolaryngeal causes [such as PTA, RPA, Ludwigs, Epiglottitis, EBV].  Regarding Emergent Travel or Immunosuppressive related infectious: I have a low suspicion for acute HIV. Patient encouraged to take her prescribed pack Slo-Bid.  Patient was concerned that she has had fatty liver disease and that this medication may worsen her liver function.  Patient was assured that this medication is renally cleared and her latest renal function is within normal limits.  Patient agrees to start taking this medication on time and as prescribed assuming she is discharged Will provide strict return precautions and instructions on  self-isolation/quarantine and anticipatory guidance.       FINAL CLINICAL IMPRESSION(S) / ED DIAGNOSES   Final diagnoses:  COVID-19 virus infection  SOB (shortness of breath)     Rx / DC Orders   ED Discharge Orders     None        Note:  This document was prepared using Dragon voice recognition software and may include unintentional dictation errors.   Merwyn Katos, MD 11/16/21 [phone]

## 2021-11-16 NOTE — ED Notes (Signed)
RN to bedside. Pt CAOx4 and in no acute distress. Pt advised she was diagnosed with COVID on Wednesday this week and her oxygen sat have been dropping. Normal this morning.

## 2021-11-16 NOTE — ED Triage Notes (Signed)
Pt states is covid positive and having shob. Pt states she was sleeping and her pox dropped to 91% thus causing her to come to ed. Pt appears in no acute distress.

## 2021-11-16 NOTE — ED Notes (Signed)
Spoke with dr. York Cerise regarding pt's presenting complaint, order for cxr received.

## 2021-11-19 ENCOUNTER — Emergency Department (HOSPITAL_COMMUNITY)
Admission: EM | Admit: 2021-11-19 | Discharge: 2021-11-19 | Discharge status: Against Medical Advice | Payer: Self-pay | Source: Home / Self Care

## 2021-11-20 ENCOUNTER — Emergency Department (HOSPITAL_COMMUNITY)
Admission: EM | Admit: 2021-11-20 | Discharge: 2021-11-21 | Disposition: A | Discharge status: Home / Self Care | Payer: Self-pay | Attending: Emergency Medicine | Admitting: Emergency Medicine

## 2021-11-20 ENCOUNTER — Other Ambulatory Visit: Payer: Self-pay

## 2021-11-20 ENCOUNTER — Encounter (HOSPITAL_COMMUNITY): Payer: Self-pay | Admitting: Emergency Medicine

## 2021-11-20 ENCOUNTER — Emergency Department (HOSPITAL_COMMUNITY): Payer: Self-pay

## 2021-11-20 DIAGNOSIS — U071 COVID-19: Secondary | ICD-10-CM | POA: Insufficient documentation

## 2021-11-20 DIAGNOSIS — Z8616 Personal history of COVID-19: Secondary | ICD-10-CM | POA: Insufficient documentation

## 2021-11-20 LAB — CBC
HCT: 40.4 % (ref 36.0–46.0)
Hemoglobin: 13.3 g/dL (ref 12.0–15.0)
MCH: 28.7 pg (ref 26.0–34.0)
MCHC: 32.9 g/dL (ref 30.0–36.0)
MCV: 87.3 fL (ref 80.0–100.0)
Platelets: 249 10*3/uL (ref 150–400)
RBC: 4.63 MIL/uL (ref 3.87–5.11)
RDW: 12.7 % (ref 11.5–15.5)
WBC: 6.8 10*3/uL (ref 4.0–10.5)
nRBC: 0 % (ref 0.0–0.2)

## 2021-11-20 NOTE — ED Notes (Signed)
Bloodwork collected by phlebotomy 

## 2021-11-20 NOTE — ED Triage Notes (Signed)
Pt states that since she was diagnosed with covid last week she has been sob, generalized weak, and back pain.

## 2021-11-20 NOTE — ED Provider Notes (Signed)
AP-EMERGENCY DEPT [facility] Emergency Department Provider Note MRN:  [id number]  Arrival date & time: 11/21/21     Chief Complaint   Fatigue   History of Present Illness   Jillian Thomas is a 71 y.o. year-old female with no pertinent past medical history presenting to the ED with chief complaint of fatigue.  Patient is endorsing fatigue and body aches for about 1 week.  Tested positive for COVID-19 1 week ago.  More recently experiencing shortness of breath as well as some central chest tightness today.  Feels similar to the last time she had COVID-pneumonia.  Has been watching her oxygen saturations go up and down at home, as low as 94%.  Denies headache or vision change, no vomiting or diarrhea, no abdominal pain.  Review of Systems  A thorough review of systems was obtained and all systems are negative except as noted in the HPI and PMH.   Patient's Health History    Past Medical History:  Diagnosis Date   COVID-19     Past Surgical History:  Procedure Laterality Date   CHOLECYSTECTOMY     TUBAL LIGATION      No family history on file.  Social History   Socioeconomic History   Marital status: Married    Spouse name: Not on file   Number of children: Not on file   Years of education: Not on file   Highest education level: Not on file  Occupational History   Not on file  Tobacco Use   Smoking status: Never   Smokeless tobacco: Never  Vaping Use   Vaping Use: Never used  Substance and Sexual Activity   Alcohol use: No   Drug use: No   Sexual activity: Not on file  Other Topics Concern   Not on file  Social History Narrative   ** Merged History Encounter **       Social Determinants of Health   Financial Resource Strain: Not on file  Food Insecurity: Not on file  Transportation Needs: Not on file  Physical Activity: Not on file  Stress: Not on file  Social Connections: Not on file  Intimate Partner Violence: Not on file     Physical Exam    Vitals:   11/21/21 0000 11/21/21 0030  BP: 111/73 120/75  Pulse: 73 71  Resp:  20  Temp:    SpO2: 94% 96%    CONSTITUTIONAL: Well-appearing, NAD NEURO/PSYCH:  Alert and oriented x 3, no focal deficits EYES:  eyes equal and reactive ENT/NECK:  no LAD, no JVD CARDIO: Regular rate, well-perfused, normal S1 and S2 PULM:  CTAB no wheezing or rhonchi GI/GU:  non-distended, non-tender MSK/SPINE:  No gross deformities, no edema SKIN:  no rash, atraumatic   *Additional and/or pertinent findings included in MDM below  Diagnostic and Interventional Summary    EKG Interpretation  Date/Time:    Ventricular Rate:    PR Interval:    QRS Duration:   QT Interval:    QTC Calculation:   R Axis:     Text Interpretation:         Labs Reviewed  COMPREHENSIVE METABOLIC PANEL - Abnormal; Notable for the following components:      Result Value   Glucose, Bld 113 (*)    All other components within normal limits  CBC  TROPONIN I (HIGH SENSITIVITY)    DG Chest Port 1 View  Final Result      Medications - No data to display  Procedures  /  Critical Care Procedures  ED Course and Medical Decision Making  Initial Impression and Ddx Patient is overall well-appearing, sitting comfortably with normal vital signs, no hypoxia, no increased work of breathing.  Lungs are largely clear.  Suspect symptoms can all be explained by COVID-19, however given her age and risk factors will obtain EKG and troponin to evaluate for ACS.  No evidence of leg pain or swelling, doubt PE.   Past medical/surgical history that increases complexity of ED encounter: Obesity, tobacco use  Interpretation of Diagnostics I personally reviewed the EKG and my interpretation is as follows: Sinus rhythm, no significant change from prior    Labs and chest x-ray are reassuring, troponin is negative.  Patient Reassessment and Ultimate Disposition/Management On reassessment patient continues to look and feel well  with normal vital signs, no increased work of breathing, no hypoxia, symptoms seem well explained by COVID-19, appropriate for discharge.  Patient management required discussion with the following services or consulting groups:  None  Complexity of Problems Addressed Acute complicated illness or Injury  Additional Data Reviewed and Analyzed Further history obtained from: Past medical history and medications listed in the EMR   Elmer Sow. Pilar Plate, MD [facility] Emergency Medicine [facility] [email or web address]  Final Clinical Impressions(s) / ED Diagnoses     ICD-10-CM   1. COVID-19  U07.1       ED Discharge Orders     None        Discharge Instructions Discussed with and Provided to Patient:     Discharge Instructions      You were evaluated in the Emergency Department and after careful evaluation, we did not find any emergent condition requiring admission or further testing in the hospital.  Your exam/testing today was overall reassuring.  Symptoms seem well explained by COVID-19.  Recommend continued rest, quarantine at home.  Tylenol or Motrin for pain.  Please return to the Emergency Department if you experience any worsening of your condition.  Thank you for allowing [place] to be a part of your care.        Sabas Sous, MD 11/21/21 [id number]

## 2021-11-21 LAB — COMPREHENSIVE METABOLIC PANEL
ALT: 15 U/L (ref 0–44)
AST: 15 U/L (ref 15–41)
Albumin: 4 g/dL (ref 3.5–5.0)
Alkaline Phosphatase: 48 U/L (ref 38–126)
Anion gap: 10 (ref 5–15)
BUN: 10 mg/dL (ref 8–23)
CO2: 24 mmol/L (ref 22–32)
Calcium: 9 mg/dL (ref 8.9–10.3)
Chloride: 104 mmol/L (ref 98–111)
Creatinine, Ser: 0.8 mg/dL (ref 0.44–1.00)
GFR, Estimated: >60 mL/min (ref >60)
Glucose, Bld: 113 mg/dL — ABNORMAL HIGH (ref 70–99)
Potassium: 3.7 mmol/L (ref 3.5–5.1)
Sodium: 138 mmol/L (ref 135–145)
Total Bilirubin: 0.5 mg/dL (ref 0.3–1.2)
Total Protein: 6.9 g/dL (ref 6.5–8.1)

## 2021-11-21 LAB — TROPONIN I (HIGH SENSITIVITY): Troponin I (High Sensitivity): 3 ng/L (ref <18)

## 2021-11-21 NOTE — ED Notes (Signed)
Went over dc papers. All questions answered. Ambulatory to lobby with family.  

## 2021-11-21 NOTE — Discharge Instructions (Signed)
You were evaluated in the Emergency Department and after careful evaluation, we did not find any emergent condition requiring admission or further testing in the hospital.  Your exam/testing today was overall reassuring.  Symptoms seem well explained by COVID-19.  Recommend continued rest, quarantine at home.  Tylenol or Motrin for pain.  Please return to the Emergency Department if you experience any worsening of your condition.  Thank you for allowing [place] to be a part of your care.

## 2021-11-26 ENCOUNTER — Encounter: Payer: Self-pay | Admitting: Emergency Medicine

## 2021-11-26 ENCOUNTER — Emergency Department: Payer: Self-pay

## 2021-11-26 ENCOUNTER — Emergency Department
Admission: EM | Admit: 2021-11-26 | Discharge: 2021-11-26 | Disposition: A | Discharge status: Home / Self Care | Payer: Self-pay | Attending: Emergency Medicine | Admitting: Emergency Medicine

## 2021-11-26 ENCOUNTER — Other Ambulatory Visit: Payer: Self-pay

## 2021-11-26 DIAGNOSIS — N390 Urinary tract infection, site not specified: Secondary | ICD-10-CM | POA: Insufficient documentation

## 2021-11-26 DIAGNOSIS — R0602 Shortness of breath: Secondary | ICD-10-CM | POA: Insufficient documentation

## 2021-11-26 DIAGNOSIS — M546 Pain in thoracic spine: Secondary | ICD-10-CM | POA: Insufficient documentation

## 2021-11-26 DIAGNOSIS — N200 Calculus of kidney: Secondary | ICD-10-CM | POA: Insufficient documentation

## 2021-11-26 LAB — BASIC METABOLIC PANEL
Anion gap: 9 (ref 5–15)
BUN: 12 mg/dL (ref 8–23)
CO2: 27 mmol/L (ref 22–32)
Calcium: 9.8 mg/dL (ref 8.9–10.3)
Chloride: 103 mmol/L (ref 98–111)
Creatinine, Ser: 0.84 mg/dL (ref 0.44–1.00)
GFR, Estimated: >60 mL/min (ref >60)
Glucose, Bld: 119 mg/dL — ABNORMAL HIGH (ref 70–99)
Potassium: 3.9 mmol/L (ref 3.5–5.1)
Sodium: 139 mmol/L (ref 135–145)

## 2021-11-26 LAB — URINALYSIS, ROUTINE W REFLEX MICROSCOPIC
Bilirubin Urine: NEGATIVE
Glucose, UA: NEGATIVE mg/dL
Ketones, ur: NEGATIVE mg/dL
Nitrite: NEGATIVE
Protein, ur: NEGATIVE mg/dL
Specific Gravity, Urine: 1.013 (ref 1.005–1.030)
pH: 6 (ref 5.0–8.0)

## 2021-11-26 LAB — HEPATIC FUNCTION PANEL
ALT: 14 U/L (ref 0–44)
AST: 17 U/L (ref 15–41)
Albumin: 4.1 g/dL (ref 3.5–5.0)
Alkaline Phosphatase: 49 U/L (ref 38–126)
Bilirubin, Direct: 0.1 mg/dL (ref 0.0–0.2)
Indirect Bilirubin: 0.6 mg/dL (ref 0.3–0.9)
Total Bilirubin: 0.7 mg/dL (ref 0.3–1.2)
Total Protein: 6.9 g/dL (ref 6.5–8.1)

## 2021-11-26 LAB — CBC WITH DIFFERENTIAL/PLATELET
Abs Immature Granulocytes: 0.03 10*3/uL (ref 0.00–0.07)
Basophils Absolute: 0 10*3/uL (ref 0.0–0.1)
Basophils Relative: 0 %
Eosinophils Absolute: 0.1 10*3/uL (ref 0.0–0.5)
Eosinophils Relative: 1 %
HCT: 39.8 % (ref 36.0–46.0)
Hemoglobin: 13.7 g/dL (ref 12.0–15.0)
Immature Granulocytes: 0 %
Lymphocytes Relative: 24 %
Lymphs Abs: 2.2 10*3/uL (ref 0.7–4.0)
MCH: 29.8 pg (ref 26.0–34.0)
MCHC: 34.4 g/dL (ref 30.0–36.0)
MCV: 86.5 fL (ref 80.0–100.0)
Monocytes Absolute: 0.5 10*3/uL (ref 0.1–1.0)
Monocytes Relative: 5 %
Neutro Abs: 6.5 10*3/uL (ref 1.7–7.7)
Neutrophils Relative %: 70 %
Platelets: 246 10*3/uL (ref 150–400)
RBC: 4.6 MIL/uL (ref 3.87–5.11)
RDW: 13 % (ref 11.5–15.5)
WBC: 9.4 10*3/uL (ref 4.0–10.5)
nRBC: 0 % (ref 0.0–0.2)

## 2021-11-26 LAB — LIPASE, BLOOD: Lipase: 38 U/L (ref 11–51)

## 2021-11-26 MED ORDER — CEFTRIAXONE SODIUM 1 G IJ SOLR
1.0000 g | Freq: Once | INTRAMUSCULAR | Status: AC
  Administered 2021-11-26: 1 g via INTRAVENOUS
  Filled 2021-11-26: qty 10

## 2021-11-26 MED ORDER — SODIUM CHLORIDE 0.9 % IV BOLUS
1000.0000 mL | Freq: Once | INTRAVENOUS | Status: AC
  Administered 2021-11-26: 1000 mL via INTRAVENOUS

## 2021-11-26 MED ORDER — IOHEXOL 350 MG/ML SOLN
80.0000 mL | Freq: Once | INTRAVENOUS | Status: AC | PRN
  Administered 2021-11-26: 80 mL via INTRAVENOUS
  Filled 2021-11-26: qty 80

## 2021-11-26 MED ORDER — IBUPROFEN 600 MG PO TABS
600.0000 mg | ORAL_TABLET | ORAL | Status: AC
  Administered 2021-11-26: 600 mg via ORAL
  Filled 2021-11-26: qty 1

## 2021-11-26 MED ORDER — ONDANSETRON 4 MG PO TBDP
4.0000 mg | ORAL_TABLET | Freq: Once | ORAL | Status: AC
  Administered 2021-11-26: 4 mg via ORAL
  Filled 2021-11-26: qty 1

## 2021-11-26 MED ORDER — AMOXICILLIN-POT CLAVULANATE 500-125 MG PO TABS: 1.0000 | ORAL_TABLET | Freq: Two times a day (BID) | ORAL | 0 refills | Status: DC

## 2021-11-26 NOTE — ED Provider Notes (Signed)
[facility] Provider Note    Event Date/Time   First MD Initiated Contact with Patient 11/26/21 [phone]     (approximate)   History   Back Pain and Nausea   HPI  Jillian Thomas is a 71 y.o. female with a history of recent COVID infection about 2 weeks ago as well as a history of chronic back pain.  About 2 weeks ago she was diagnosed with COVID-19.  She reports that she had cough chills aches and some shortness of breath.  The symptoms have largely resolved but now she is having upper back pain started in the area between her upper shoulder blades that tends to radiate some pain down towards her right mid rib cage along the bottom of her right rib cage.  Some nausea but no vomiting.  Reports decreased appetite and generally not wanting to eat much or drink much for the last several days.  She started having the symptoms and went to [facility] and was seen there and reports she had a chest x-ray done  She has not been on any antibiotics.  She also noticed over the last few days that she is having slight discomfort with urination as well   Reviewed patient's outpatient note from Dr. Jodene Nam patient seen for evaluation of back pain in August of this year  Physical Exam   Triage Vital Signs: ED Triage Vitals  Enc Vitals Group     BP 11/26/21 1808 122/63     Pulse Rate 11/26/21 1808 82     Resp 11/26/21 1808 16     Temp 11/26/21 1808 98.6 F (37 C)     Temp Source 11/26/21 1808 Oral     SpO2 11/26/21 1808 98 %     Weight 11/26/21 1855 183 lb (83 kg)     Height 11/26/21 1855 5\' 3"  (1.6 m)     Head Circumference --      Peak Flow --      Pain Score 11/26/21 1854 8     Pain Loc --      Pain Edu? --      Excl. in [place]? --     Most recent vital signs: Vitals:   11/26/21 1808  BP: 122/63  Pulse: 82  Resp: 16  Temp: 98.6 F (37 C)  SpO2: 98%     General: Awake, no distress.  Ambulates without difficulty.  No noted respiratory  distress. CV:  Good peripheral perfusion.  Normal heart sounds regular rate and rhythm. Resp:  Normal effort.  Clear lung sounds.  No noted cough.  Denies pleuritic pain.  Does report some discomfort in her upper back in the area between her shoulder blades that is where she is experiencing discomfort and slightly above.  Denies cervical or thoracic tenderness.  No overlying lesions. Abd:  No distention.  Reports mild discomfort in the suprapubic region without rebound or guarding.  Also reports mild pain in the right upper quadrant without rebound or guarding.  No epigastric pain no left upper quadrant or pain noted in the bilateral lower quadrants Other:  Overall well-appearing nontoxic in no distress.  Neurologically intact fully awake and alert oriented pleasant.  Ambulates without difficulty   ED Results / Procedures / Treatments   Labs (all labs ordered are listed, but only abnormal results are displayed) Labs Reviewed  BASIC METABOLIC PANEL - Abnormal; Notable for the following components:      Result Value   Glucose,  Bld 119 (*)    All other components within normal limits  URINALYSIS, ROUTINE W REFLEX MICROSCOPIC - Abnormal; Notable for the following components:   Color, Urine YELLOW (*)    APPearance HAZY (*)    Hgb urine dipstick MODERATE (*)    Leukocytes,Ua MODERATE (*)    Bacteria, UA RARE (*)    All other components within normal limits  URINE CULTURE  CBC WITH DIFFERENTIAL/PLATELET  HEPATIC FUNCTION PANEL  LIPASE, BLOOD     EKG  Is reviewed inter by me at 2039 heart rate 79 QRS 79 QTc 430 Normal sinus rhythm no evidence of acute ischemia   RADIOLOGY  Reviewed the patient's CT chest.  I do not see evidence of acute major process such as cyst saddle embolism, pneumothorax large infiltrate.  Further radiologist further review by radiologist   CTA of the chest: No evidence of pulmonary emboli.   Mild dependent atelectatic changes.   CT of the abdomen and  pelvis: Nonobstructing left upper pole renal  stone measuring up to 5 mm. (I discussed this finding of kidney stone, appear to be incidental in nature at this point no associated flank pain or discomfort.  No evidence of obstruction.)  No other focal abnormality is noted.       PROCEDURES:  Critical Care performed: No  Procedures   MEDICATIONS ORDERED IN ED: Medications  sodium chloride 0.9 % bolus 1,000 mL (1,000 mLs Intravenous New Bag/Given 11/26/21 2021)  ibuprofen (ADVIL) tablet 600 mg (600 mg Oral Given 11/26/21 2021)  ondansetron (ZOFRAN-ODT) disintegrating tablet 4 mg (4 mg Oral Given 11/26/21 2021)  iohexol (OMNIPAQUE) 350 MG/ML injection 80 mL (80 mLs Intravenous Contrast Given 11/26/21 2003)  cefTRIAXone (ROCEPHIN) 1 g in sodium chloride 0.9 % 100 mL IVPB (1 g Intravenous New Bag/Given 11/26/21 2022)     IMPRESSION / MDM / ASSESSMENT AND PLAN / ED COURSE  I reviewed the triage vital signs and the nursing notes.                              Differential diagnosis includes, but is not limited to, post viral pneumonia, pleurisy, pulmonary embolism in the setting of recent COVID-19 infection, pneumothorax, rib fracture or musculoskeletal pain.  Doubt ACS.  No associated chest pain.  EKG reassuring.  Patient was reports some slight dysuria over the last couple days.  On exam also noted to have some right upper quadrant tenderness.  Proceed by obtaining imaging  LFTs normal  Urinalysis does show some bacteria and evidence of inflammation given her development of dysuria I suspect she may be developing urinary tract infection.  We will treat with Rocephin here, and prescribe amoxicillin for home.  Also reports decreased appetite in the setting of recent COVID infection will provide IV hydration.  Rule out pulmonary embolism or pneumonia, awaiting CT angiography as well as CT of her abdomen   Return precautions and treatment recommendations and follow-up discussed with the  patient who is agreeable with the plan.  Resting comfortably.  Feels improved after receiving fluids.  Started Rocephin.  Will start amoxicillin tomorrow.  She plans to follow-up with her primary care doctor.        FINAL CLINICAL IMPRESSION(S) / ED DIAGNOSES   Final diagnoses:  Lower urinary tract infection, acute  Kidney stone  Acute thoracic back pain, unspecified back pain laterality     Rx / DC Orders   ED Discharge Orders  Ordered    amoxicillin-clavulanate (AUGMENTIN) 500-125 MG tablet  2 times daily        11/26/21 2110             Note:  This document was prepared using Dragon voice recognition software and may include unintentional dictation errors.   Sharyn Creamer, MD 11/26/21 2112

## 2021-11-26 NOTE — ED Triage Notes (Signed)
Pt to ED via POV with c/o being tested for COVID on 11/13/2021 and is having generalized weakness and back pain. Pt has been here several times for the same symptoms in the last month. The last MD told her that she should start to feel better in the next couple of days but she reports that she isnt getting any better. She did have an xray when she went to [facility]

## 2021-11-26 NOTE — ED Notes (Signed)
Pt states she was dx with COVID on 11/13/21 but she keeps feeling worse. Pt reports pain in her "lungs & back". Pt in NAD at this time, chest rise & fall even & breathing unlabored on room air.

## 2021-11-28 LAB — URINE CULTURE: Culture: <10000 — AB

## 2022-04-29 ENCOUNTER — Emergency Department: Payer: Self-pay

## 2022-04-29 ENCOUNTER — Emergency Department
Admission: EM | Admit: 2022-04-29 | Discharge: 2022-04-29 | Disposition: A | Discharge status: Home / Self Care | Payer: Self-pay | Attending: Emergency Medicine | Admitting: Emergency Medicine

## 2022-04-29 ENCOUNTER — Encounter: Payer: Self-pay | Admitting: Emergency Medicine

## 2022-04-29 ENCOUNTER — Other Ambulatory Visit: Payer: Self-pay

## 2022-04-29 DIAGNOSIS — S93602A Unspecified sprain of left foot, initial encounter: Secondary | ICD-10-CM | POA: Insufficient documentation

## 2022-04-29 DIAGNOSIS — Y9301 Activity, walking, marching and hiking: Secondary | ICD-10-CM | POA: Insufficient documentation

## 2022-04-29 DIAGNOSIS — W1842XA Slipping, tripping and stumbling without falling due to stepping into hole or opening, initial encounter: Secondary | ICD-10-CM | POA: Insufficient documentation

## 2022-04-29 NOTE — ED Provider Notes (Signed)
[facility] Provider Note    Event Date/Time   First MD Initiated Contact with Patient 04/29/22 [phone]     (approximate)   History   Chief Complaint Foot Injury   HPI  Jillian Thomas is a 71 y.o. female with no significant past medical history who presents to the ED complaining of foot pain.  Patient reports that last night she was walking her dog outside when she accidentally stepped in a hole with her left foot.  She states that her foot shifted outwards and she had immediate onset of pain.  She has been having sharp pain in the lateral portion of her foot whenever she tries to walk since then.  She denies any associated swelling and has not had any pain in her ankle or knee.  She denies prior injuries to this foot.  She has not taken any medication for pain prior to arrival.     Physical Exam   Triage Vital Signs: ED Triage Vitals  Enc Vitals Group     BP 04/29/22 0737 134/81     Pulse Rate 04/29/22 0737 72     Resp 04/29/22 0737 18     Temp 04/29/22 0737 98.2 F (36.8 C)     Temp Source 04/29/22 0737 Oral     SpO2 04/29/22 0737 94 %     Weight 04/29/22 0731 180 lb (81.6 kg)     Height 04/29/22 0731 5\' 3"  (1.6 m)     Head Circumference --      Peak Flow --      Pain Score 04/29/22 0731 8     Pain Loc --      Pain Edu? --      Excl. in GC? --     Most recent vital signs: Vitals:   04/29/22 0737  BP: 134/81  Pulse: 72  Resp: 18  Temp: 98.2 F (36.8 C)  SpO2: 94%    Constitutional: Alert and oriented. Eyes: Conjunctivae are normal. Head: Atraumatic. Nose: No congestion/rhinnorhea. Mouth/Throat: Mucous membranes are moist.  Cardiovascular: Normal rate, regular rhythm. Grossly normal heart sounds.  2+ radial and DP pulses bilaterally. Respiratory: Normal respiratory effort.  No retractions. Lungs CTAB. Gastrointestinal: Soft and nontender. No distention. Musculoskeletal: Tenderness to palpation over the lateral portion of left foot  with no tenderness at the lateral or medial malleolus of ankle. Neurologic:  Normal speech and language. No gross focal neurologic deficits are appreciated.    ED Results / Procedures / Treatments   Labs (all labs ordered are listed, but only abnormal results are displayed) Labs Reviewed - No data to display  RADIOLOGY Foot x-ray reviewed and interpreted by me with no fracture or dislocation.  PROCEDURES:  Critical Care performed: No  Procedures   MEDICATIONS ORDERED IN ED: Medications - No data to display   IMPRESSION / MDM / ASSESSMENT AND PLAN / ED COURSE  I reviewed the triage vital signs and the nursing notes.                              70 y.o. female with no significant past medical history presents to the ED after stepping in a hole last night and twisting her left foot, now with pain on the lateral portion when bearing weight.  Patient's presentation is most consistent with acute complicated illness / injury requiring diagnostic workup.  Differential diagnosis includes, but is not limited to, foot fracture,  foot dislocation, Jones fracture, Lisfranc injury, sprain.  Patient well-appearing and in no acute distress, vital signs are unremarkable and she is neurovascular intact to her distal left lower extremity.  No obvious deformities on exam and she has no ankle tenderness to necessitate imaging of the ankle.  We will further assess with x-rays of the foot, patient declines any pain medication at this time.  X-rays of left foot are unremarkable, patient is appropriate for discharge home with PCP follow-up.  She was counseled on rest, ice, elevation, and NSAIDs as needed.  She was counseled to return to the ED for new or worsening symptoms, patient agrees with plan.      FINAL CLINICAL IMPRESSION(S) / ED DIAGNOSES   Final diagnoses:  Sprain of left foot, initial encounter     Rx / DC Orders   ED Discharge Orders     None        Note:  This document  was prepared using Dragon voice recognition software and may include unintentional dictation errors.   [name], MD 04/29/22 [phone]

## 2022-04-29 NOTE — ED Notes (Signed)
See triage note  presents with injury to left foot states she stepped in a hole last pm  twisted her foot  unable to bear full wt

## 2022-04-29 NOTE — ED Triage Notes (Signed)
Pt via POV from home. States she was walking her dog last night and stepped into a hole, states that now since then she has been having L foot pain. Denies swelling. Pt is A&Ox4 and NAD. Ambulatory to triage.

## 2022-10-09 ENCOUNTER — Emergency Department: Payer: Self-pay

## 2022-10-09 ENCOUNTER — Emergency Department
Admission: EM | Admit: 2022-10-09 | Discharge: 2022-10-09 | Disposition: A | Discharge status: Home / Self Care | Payer: Self-pay | Attending: Emergency Medicine | Admitting: Emergency Medicine

## 2022-10-09 ENCOUNTER — Other Ambulatory Visit: Payer: Self-pay

## 2022-10-09 DIAGNOSIS — R0789 Other chest pain: Secondary | ICD-10-CM

## 2022-10-09 DIAGNOSIS — M545 Low back pain, unspecified: Secondary | ICD-10-CM

## 2022-10-09 DIAGNOSIS — N12 Tubulo-interstitial nephritis, not specified as acute or chronic: Secondary | ICD-10-CM

## 2022-10-09 DIAGNOSIS — R0602 Shortness of breath: Secondary | ICD-10-CM | POA: Insufficient documentation

## 2022-10-09 DIAGNOSIS — Z20822 Contact with and (suspected) exposure to covid-19: Secondary | ICD-10-CM | POA: Insufficient documentation

## 2022-10-09 LAB — URINALYSIS, ROUTINE W REFLEX MICROSCOPIC
Bacteria, UA: NONE SEEN
Bilirubin Urine: NEGATIVE
Glucose, UA: NEGATIVE mg/dL
Ketones, ur: NEGATIVE mg/dL
Nitrite: NEGATIVE
Protein, ur: NEGATIVE mg/dL
Specific Gravity, Urine: 1.017 (ref 1.005–1.030)
pH: 5 (ref 5.0–8.0)

## 2022-10-09 LAB — BASIC METABOLIC PANEL
Anion gap: 6 (ref 5–15)
BUN: 15 mg/dL (ref 8–23)
CO2: 25 mmol/L (ref 22–32)
Calcium: 9.6 mg/dL (ref 8.9–10.3)
Chloride: 110 mmol/L (ref 98–111)
Creatinine, Ser: 0.94 mg/dL (ref 0.44–1.00)
GFR, Estimated: >60 mL/min (ref >60)
Glucose, Bld: 120 mg/dL — ABNORMAL HIGH (ref 70–99)
Potassium: 3.9 mmol/L (ref 3.5–5.1)
Sodium: 141 mmol/L (ref 135–145)

## 2022-10-09 LAB — CBC
HCT: 40.6 % (ref 36.0–46.0)
Hemoglobin: 13.5 g/dL (ref 12.0–15.0)
MCH: 29 pg (ref 26.0–34.0)
MCHC: 33.3 g/dL (ref 30.0–36.0)
MCV: 87.1 fL (ref 80.0–100.0)
Platelets: 213 10*3/uL (ref 150–400)
RBC: 4.66 MIL/uL (ref 3.87–5.11)
RDW: 13.3 % (ref 11.5–15.5)
WBC: 5.4 10*3/uL (ref 4.0–10.5)
nRBC: 0 % (ref 0.0–0.2)

## 2022-10-09 LAB — TROPONIN I (HIGH SENSITIVITY)
Troponin I (High Sensitivity): 4 ng/L (ref <18)
Troponin I (High Sensitivity): 4 ng/L (ref <18)

## 2022-10-09 LAB — RESP PANEL BY RT-PCR (FLU A&B, COVID) ARPGX2
Influenza A by PCR: NEGATIVE
Influenza B by PCR: NEGATIVE
SARS Coronavirus 2 by RT PCR: NEGATIVE

## 2022-10-09 LAB — LIPASE, BLOOD: Lipase: 36 U/L (ref 11–51)

## 2022-10-09 MED ORDER — CEFDINIR 300 MG PO CAPS: 300.0000 mg | ORAL_CAPSULE | Freq: Two times a day (BID) | ORAL | 0 refills | Status: AC

## 2022-10-09 MED ORDER — SODIUM CHLORIDE 0.9 % IV SOLN
1.0000 g | Freq: Once | INTRAVENOUS | Status: AC
  Administered 2022-10-09: 1 g via INTRAVENOUS
  Filled 2022-10-09: qty 10

## 2022-10-09 NOTE — Discharge Instructions (Addendum)
Take the antibiotic as prescribed and finish the full 10-day course.  You may take ibuprofen or Tylenol over-the-counter as needed for pain.  Return to the ER for new, worsening, or persistent severe back, abdominal, or chest pain, fevers, weakness, vomiting, difficulty breathing, or any other new or worsening symptoms that concern you.

## 2022-10-09 NOTE — ED Provider Notes (Signed)
[facility] Provider Note    Event Date/Time   First MD Initiated Contact with Patient 10/09/22 1040     (approximate)   History   Back Pain and Chest Pain   HPI  Jillian Thomas is a 71 y.o. female with no active medical problems who presents with low back pain for approximately the last week, mainly on the right side but radiating across to the left as well.  It comes and goes and she has not identified any aggravating or alleviating factors.  It also radiates around to the lower abdomen.  She denies any associated nausea, vomiting, diarrhea, or urinary symptoms.  She has no fever or chills.  Today she started having some chest pain when she woke up.  It radiated to her left arm which felt cold but not numb or weak.  She states that the pain is now resolved.  She was feeling slightly short of breath with it but this is resolved as well.  Reviewed the past medical records.  She had some ED visits in January with symptoms related to COVID-19 and subsequently in [place] for foot pain.  Her most recent outpatient encounter was also in January at [facility] for [place].   Physical Exam   Triage Vital Signs: ED Triage Vitals [10/09/22 1000]  Enc Vitals Group     BP 129/87     Pulse Rate 79     Resp 18     Temp 98 F (36.7 C)     Temp Source Oral     SpO2 95 %     Weight 190 lb (86.2 kg)     Height 5\' 3"  (1.6 m)     Head Circumference      Peak Flow      Pain Score 8     Pain Loc      Pain Edu?      Excl. in [place]?     Most recent vital signs: Vitals:   10/09/22 1229 10/09/22 1306  BP: (!) 161/81 (!) 163/72  Pulse: (!) 57 (!) 57  Resp: 17 16  Temp:  98.3 F (36.8 C)  SpO2: 95% 97%     General: Awake, no distress.  CV:  Good peripheral perfusion.  Resp:  Normal effort.  Abd:  Soft and nontender.  No distention.  Other:  Bilateral lumbar paraspinal muscle tenderness.  No midline spinal tenderness.  5/5 motor strength and intact sensation of bilateral  upper and lower extremities.   ED Results / Procedures / Treatments   Labs (all labs ordered are listed, but only abnormal results are displayed) Labs Reviewed  BASIC METABOLIC PANEL - Abnormal; Notable for the following components:      Result Value   Glucose, Bld 120 (*)    All other components within normal limits  URINALYSIS, ROUTINE W REFLEX MICROSCOPIC - Abnormal; Notable for the following components:   Color, Urine YELLOW (*)    APPearance CLOUDY (*)    Hgb urine dipstick SMALL (*)    Leukocytes,Ua LARGE (*)    All other components within normal limits  RESP PANEL BY RT-PCR (FLU A&B, COVID) [id number]  CBC  LIPASE, BLOOD  TROPONIN I (HIGH SENSITIVITY)  TROPONIN I (HIGH SENSITIVITY)     EKG  ED ECG REPORT I, Arta Silence, the attending physician, personally viewed and interpreted this ECG.  Date: 10/09/2022 EKG Time: 0956 Rate: 76 Rhythm: normal sinus rhythm QRS Axis: normal Intervals: normal ST/T Wave abnormalities: normal  Narrative Interpretation: no evidence of acute ischemia    RADIOLOGY  Chest x-ray: I independently viewed and interpreted the images; there is no focal consolidation or edema  CT abdomen/pelvis: No acute findings  CT lumbar spine: No acute fracture  PROCEDURES:  Critical Care performed: No  Procedures   MEDICATIONS ORDERED IN ED: Medications  cefTRIAXone (ROCEPHIN) 1 g in sodium chloride 0.9 % 100 mL IVPB (0 g Intravenous Stopped 10/09/22 1251)     IMPRESSION / MDM / ASSESSMENT AND PLAN / ED COURSE  I reviewed the triage vital signs and the nursing notes.  71 year old female with no active medical problems presents with primarily right-sided low back and flank pain as well as an episode of atypical chest pain today which is now resolved.  Physical exam is unremarkable.  Differential diagnosis includes, but is not limited to, musculoskeletal low back pain, radiculopathy, UTI/pyelonephritis, ureteral stone, less likely  diverticulitis, colitis, or other acute intra-abdominal process.  The chest pain is atypical and resolved.  The EKG and initial troponin are negative.  Chest x-ray is unremarkable.  We will obtain a repeat troponin.  Pain is overall consistent with musculoskeletal or other benign etiology.  My suspicion for ACS is low.  There is no clinical evidence for PE, aortic dissection, or other vascular etiology.  Patient's presentation is most consistent with acute presentation with potential threat to life or bodily function.  ----------------------------------------- 1:00 PM on 10/09/2022 -----------------------------------------  CT of the abdomen and lumbar spine is negative.  Lab work-up is overall unremarkable with normal electrolytes, normal lipase, and no leukocytosis.  Respiratory panel is negative.  Troponins are negative x2.  Urinalysis does show large leukocytes and significant WBCs, somewhat concerning for UTI which in combination with the right-sided pain could indicate mild pyelonephritis.  The patient is stable with normal vital signs and is tolerating p.o.  She does not require inpatient admission however I will treat with antibiotics.  I gave a dose of IV ceftriaxone here and will treat with Omnicef for 10 days.  I counseled the patient on the results of the work-up.  She feels well and is comfortable going home.  I gave strict return precautions and she expressed understanding.   FINAL CLINICAL IMPRESSION(S) / ED DIAGNOSES   Final diagnoses:  Acute bilateral low back pain without sciatica  Pyelonephritis  Atypical chest pain     Rx / DC Orders   ED Discharge Orders          Ordered    cefdinir (OMNICEF) 300 MG capsule  2 times daily        10/09/22 1259             Note:  This document was prepared using Dragon voice recognition software and may include unintentional dictation errors.    [name], MD 10/09/22 1546

## 2022-10-09 NOTE — ED Triage Notes (Signed)
Pt here with back and chest pain. Pt states the back pain started 2 days ago, pt denies fall or injury but states pain is so bad it makes her stomach hurt. Pt states cp started yesterday and is centered and radiates to her left arm making it feel cold. Pt states pain is intermittent.

## 2022-10-18 ENCOUNTER — Ambulatory Visit
Admission: EM | Admit: 2022-10-18 | Discharge: 2022-10-18 | Disposition: A | Discharge status: Home / Self Care | Payer: Self-pay

## 2022-10-18 DIAGNOSIS — Z1152 Encounter for screening for COVID-19: Secondary | ICD-10-CM | POA: Insufficient documentation

## 2022-10-18 DIAGNOSIS — Z20828 Contact with and (suspected) exposure to other viral communicable diseases: Secondary | ICD-10-CM | POA: Insufficient documentation

## 2022-10-18 LAB — RESP PANEL BY RT-PCR (FLU A&B, COVID) ARPGX2
Influenza A by PCR: NEGATIVE
Influenza B by PCR: NEGATIVE
SARS Coronavirus 2 by RT PCR: NEGATIVE

## 2022-10-18 NOTE — ED Provider Notes (Signed)
[facility]    CSN: [id number] Arrival date & time: 10/18/22  1108      History   Chief Complaint Chief Complaint  Patient presents with   Fatigue   Nasal Congestion         HPI Jillian Thomas is a 71 y.o. female.   The history is provided by the patient.   Patient presents after an influenza exposure.  Patient states that her granddaughter tested positive for flu on 10/14/2022.  Patient states now she is feeling fatigued and has nasal congestion.  She denies fever, chills, sore throat, headache, cough, or GI symptoms.  Patient reports she has not been vaccinated for influenza this year.  Past Medical History:  Diagnosis Date   COVID-19     Patient Active Problem List   Diagnosis Date Noted   Palpitations 03/14/2016   Obesity 03/14/2016    Past Surgical History:  Procedure Laterality Date   CHOLECYSTECTOMY     TUBAL LIGATION      OB History   No obstetric history on file.      Home Medications    Prior to Admission medications   Medication Sig Start Date End Date Taking? Authorizing Provider  Zinc Sulfate (ZINC 15 PO) Take by mouth.   Yes [provider]  cefdinir (OMNICEF) 300 MG capsule Take 1 capsule (300 mg total) by mouth 2 (two) times daily for 10 days. 10/09/22 10/19/22  [name], MD  dicyclomine (BENTYL) 10 MG capsule Take 1 capsule (10 mg total) by mouth 4 (four) times daily -  before meals and at bedtime for 5 days. 11/13/21 11/18/21  Orvil Feil, PA-C  nitroGLYCERIN (NITROSTAT) 0.4 MG SL tablet Place 1 tablet (0.4 mg total) under the tongue every 5 (five) minutes as needed for chest pain. 02/05/16   Almond Lint, MD  promethazine (PHENERGAN) 12.5 MG tablet Take 1 tablet (12.5 mg total) by mouth every 6 (six) hours as needed for up to 5 days for nausea or vomiting. 11/13/21 11/18/21  Orvil Feil, PA-C  propranolol (INDERAL) 10 MG tablet Take 1 tablet (10 mg total) by mouth 2 (two) times daily. Patient not taking:  Reported on 02/05/2016 01/15/16 01/29/16  Jennye Moccasin, MD  albuterol (PROVENTIL HFA;VENTOLIN HFA) 108 [phone] Base) MCG/ACT inhaler Inhale 2 puffs into the lungs every 6 (six) hours as needed for wheezing or shortness of breath. 05/30/16 05/20/20  Schaevitz, Myra Rude, MD  loratadine (CLARITIN) 10 MG tablet Take 1 tablet (10 mg total) by mouth daily. 04/18/16 05/20/20  Hagler, Ernestene Kiel, PA-C    Family History History reviewed. No pertinent family history.  Social History Social History   Tobacco Use   Smoking status: Never   Smokeless tobacco: Never  Vaping Use   Vaping Use: Never used  Substance Use Topics   Alcohol use: No   Drug use: No     Allergies   Patient has no known allergies.   Review of Systems Review of Systems Per HPI  Physical Exam Triage Vital Signs ED Triage Vitals  Enc Vitals Group     BP 10/18/22 1230 (!) 135/90     Pulse Rate 10/18/22 1230 76     Resp 10/18/22 1230 18     Temp 10/18/22 1230 98.1 F (36.7 C)     Temp Source 10/18/22 1230 Oral     SpO2 10/18/22 1230 94 %     Weight --      Height --  Head Circumference --      Peak Flow --      Pain Score 10/18/22 1229 0     Pain Loc --      Pain Edu? --      Excl. in [place]? --    No data found.  Updated Vital Signs BP (!) 135/90 (BP Location: Right Arm)   Pulse 76   Temp 98.1 F (36.7 C) (Oral)   Resp 18   LMP 03/26/2006   SpO2 94%   Visual Acuity Right Eye Distance:   Left Eye Distance:   Bilateral Distance:    Right Eye Near:   Left Eye Near:    Bilateral Near:     Physical Exam Vitals and nursing note reviewed.  Constitutional:      General: She is not in acute distress.    Appearance: Normal appearance.  HENT:     Head: Normocephalic.     Right Ear: Tympanic membrane, ear canal and external ear normal.     Left Ear: Tympanic membrane, ear canal and external ear normal.     Nose: Congestion present.     Right Turbinates: Enlarged and swollen.     Left Turbinates:  Enlarged and swollen.     Right Sinus: No maxillary sinus tenderness or frontal sinus tenderness.     Left Sinus: No maxillary sinus tenderness or frontal sinus tenderness.     Mouth/Throat:     Lips: Pink.     Mouth: Mucous membranes are moist.     Pharynx: No posterior oropharyngeal erythema.  Eyes:     Extraocular Movements: Extraocular movements intact.     Conjunctiva/sclera: Conjunctivae normal.     Pupils: Pupils are equal, round, and reactive to light.  Cardiovascular:     Rate and Rhythm: Normal rate and regular rhythm.     Pulses: Normal pulses.     Heart sounds: Normal heart sounds.  Pulmonary:     Effort: Pulmonary effort is normal. No respiratory distress.     Breath sounds: Normal breath sounds. No stridor. No wheezing, rhonchi or rales.  Abdominal:     General: Bowel sounds are normal.     Palpations: Abdomen is soft.  Musculoskeletal:     Cervical back: Normal range of motion.  Lymphadenopathy:     Cervical: No cervical adenopathy.  Skin:    General: Skin is warm and dry.  Neurological:     General: No focal deficit present.     Mental Status: She is alert and oriented to person, place, and time.  Psychiatric:        Mood and Affect: Mood normal.        Behavior: Behavior normal.      UC Treatments / Results  Labs (all labs ordered are listed, but only abnormal results are displayed) Labs Reviewed  RESP PANEL BY RT-PCR (FLU A&B, COVID) [id number]    EKG   Radiology No results found.  Procedures Procedures (including critical care time)  Medications Ordered in UC Medications - No data to display  Initial Impression / Assessment and Plan / UC Course  I have reviewed the triage vital signs and the nursing notes.  Pertinent labs & imaging results that were available during my care of the patient were reviewed by me and considered in my medical decision making (see chart for details).  Patient is well-appearing, she is in no acute distress, vital  signs are stable.  Patient presents with recent exposure to flu.  She  would like to wait until her test results are received to determine if Tamiflu is needed.  Supportive care recommendations were provided to the patient to include use of Tylenol, increasing fluids, and allowing for plenty of rest.  Patient verbalizes understanding.  All questions were answered.  Patient is stable for discharge. Final Clinical Impressions(s) / UC Diagnoses   Final diagnoses:  Exposure to the flu     Discharge Instructions      COVID/flu test is pending.  You will be contacted if the pending test results are positive. Recommend staying home until you have received your test results. Increase fluids and allow for plenty of rest. May take over-the-counter Tylenol as needed for pain or discomfort. Recommend using a humidifier in the bedroom at nighttime to help with nasal congestion. Follow-up as needed.     ED Prescriptions   None    PDMP not reviewed this encounter.   Tish Men, NP 10/18/22 1259

## 2022-10-18 NOTE — ED Triage Notes (Signed)
Pt reports fatigue, asal congestion. Reports granddaughter tested positive for Flu on 10/14/22.   Pt taking Cefdinir for UTI, tomorrow will be last dose.

## 2022-10-18 NOTE — Discharge Instructions (Signed)
COVID/flu test is pending.  You will be contacted if the pending test results are positive. Recommend staying home until you have received your test results. Increase fluids and allow for plenty of rest. May take over-the-counter Tylenol as needed for pain or discomfort. Recommend using a humidifier in the bedroom at nighttime to help with nasal congestion. Follow-up as needed.

## 2022-12-05 ENCOUNTER — Ambulatory Visit
Admission: RE | Admit: 2022-12-05 | Discharge: 2022-12-05 | Disposition: A | Discharge status: Home / Self Care | Payer: Self-pay | Source: Ambulatory Visit | Attending: Family Medicine | Admitting: Family Medicine

## 2022-12-05 VITALS — BP 136/81 | HR 79 | Temp 98.4°F | Resp 18

## 2022-12-05 DIAGNOSIS — M549 Dorsalgia, unspecified: Secondary | ICD-10-CM

## 2022-12-05 DIAGNOSIS — J0100 Acute maxillary sinusitis, unspecified: Secondary | ICD-10-CM

## 2022-12-05 MED ORDER — FLUTICASONE PROPIONATE 50 MCG/ACT NA SUSP: 1.0000 | Freq: Two times a day (BID) | NASAL | 2 refills | Status: DC

## 2022-12-05 MED ORDER — AMOXICILLIN-POT CLAVULANATE 875-125 MG PO TABS: 1.0000 | ORAL_TABLET | Freq: Two times a day (BID) | ORAL | 0 refills | Status: DC

## 2022-12-05 NOTE — ED Triage Notes (Signed)
Nasal congestion x 2 weeks.  Burning pain across top back x 3 days.

## 2022-12-05 NOTE — ED Provider Notes (Signed)
[facility]    CSN: [id number] Arrival date & time: 12/05/22  1238      History   Chief Complaint No chief complaint on file.   HPI Jillian Thomas is a 72 y.o. female.   Patient presenting today with 2-week 3 of progressively worsening nasal congestion, facial pain and pressure, sinus headache.  Occasional cough as well.  Denies chest pain, shortness of breath, fever, chills, body aches, abdominal pain, nausea vomiting or diarrhea.  Taking Mucinex DM with minimal relief.  Also having a burning pain across the top of her back bilaterally for the past few days.  Denies any known injury to the area, decreased range of motion, numbness, tingling, weakness.  Not tried anything for this so far.    Past Medical History:  Diagnosis Date   COVID-19     Patient Active Problem List   Diagnosis Date Noted   Palpitations 03/14/2016   Obesity 03/14/2016    Past Surgical History:  Procedure Laterality Date   CHOLECYSTECTOMY     TUBAL LIGATION      OB History   No obstetric history on file.      Home Medications    Prior to Admission medications   Medication Sig Start Date End Date Taking? Authorizing Provider  amoxicillin-clavulanate (AUGMENTIN) 875-125 MG tablet Take 1 tablet by mouth every 12 (twelve) hours. 12/05/22  Yes Volney American, PA-C  fluticasone [facility]) 50 MCG/ACT nasal spray Place 1 spray into both nostrils 2 (two) times daily. 12/05/22  Yes Volney American, PA-C  dicyclomine (BENTYL) 10 MG capsule Take 1 capsule (10 mg total) by mouth 4 (four) times daily -  before meals and at bedtime for 5 days. 11/13/21 11/18/21  Lannie Fields, PA-C  nitroGLYCERIN (NITROSTAT) 0.4 MG SL tablet Place 1 tablet (0.4 mg total) under the tongue every 5 (five) minutes as needed for chest pain. 02/05/16   Wende Bushy, MD  promethazine (PHENERGAN) 12.5 MG tablet Take 1 tablet (12.5 mg total) by mouth every 6 (six) hours as needed for up to 5 days for nausea or  vomiting. 11/13/21 11/18/21  Lannie Fields, PA-C  propranolol (INDERAL) 10 MG tablet Take 1 tablet (10 mg total) by mouth 2 (two) times daily. Patient not taking: Reported on 02/05/2016 01/15/16 01/29/16  Daymon Larsen, MD  Zinc Sulfate (ZINC 15 PO) Take by mouth.    [provider]  albuterol (PROVENTIL HFA;VENTOLIN HFA) 108 (90 Base) MCG/ACT inhaler Inhale 2 puffs into the lungs every 6 (six) hours as needed for wheezing or shortness of breath. 05/30/16 05/20/20  Schaevitz, Randall An, MD  loratadine (CLARITIN) 10 MG tablet Take 1 tablet (10 mg total) by mouth daily. 04/18/16 05/20/20  Hagler, Judithe Modest, PA-C    Family History History reviewed. No pertinent family history.  Social History Social History   Tobacco Use   Smoking status: Never   Smokeless tobacco: Never  Vaping Use   Vaping Use: Never used  Substance Use Topics   Alcohol use: No   Drug use: No     Allergies   Patient has no known allergies.   Review of Systems Review of Systems PER HPI  Physical Exam Triage Vital Signs ED Triage Vitals  Enc Vitals Group     BP 12/05/22 1256 136/81     Pulse Rate 12/05/22 1256 79     Resp 12/05/22 1256 18     Temp 12/05/22 1256 98.4 F (36.9 C)  Temp Source 12/05/22 1256 Oral     SpO2 12/05/22 1256 94 %     Weight --      Height --      Head Circumference --      Peak Flow --      Pain Score 12/05/22 1258 6     Pain Loc --      Pain Edu? --      Excl. in [place]? --    No data found.  Updated Vital Signs BP 136/81 (BP Location: Right Arm)   Pulse 79   Temp 98.4 F (36.9 C) (Oral)   Resp 18   LMP 03/26/2006   SpO2 94%   Visual Acuity Right Eye Distance:   Left Eye Distance:   Bilateral Distance:    Right Eye Near:   Left Eye Near:    Bilateral Near:     Physical Exam Vitals and nursing note reviewed.  Constitutional:      Appearance: Normal appearance.  HENT:     Head: Atraumatic.     Right Ear: Tympanic membrane and external ear normal.      Left Ear: Tympanic membrane and external ear normal.     Nose: Congestion present.     Mouth/Throat:     Mouth: Mucous membranes are moist.     Pharynx: Posterior oropharyngeal erythema present.  Eyes:     Extraocular Movements: Extraocular movements intact.     Conjunctiva/sclera: Conjunctivae normal.  Cardiovascular:     Rate and Rhythm: Normal rate and regular rhythm.     Heart sounds: Normal heart sounds.  Pulmonary:     Effort: Pulmonary effort is normal.     Breath sounds: Normal breath sounds. No wheezing or rales.  Musculoskeletal:        General: Tenderness present. No swelling, deformity or signs of injury. Normal range of motion.     Cervical back: Normal range of motion and neck supple.     Comments: Mild tenderness to palpation diffusely across upper back.  No midline spinal tenderness to palpation diffusely.  Grip strength full and equal bilateral hands  Skin:    General: Skin is warm and dry.     Findings: No bruising or erythema.  Neurological:     Mental Status: She is alert and oriented to person, place, and time.     Motor: No weakness.     Gait: Gait normal.     Comments: Bilateral upper extremities neurovascularly intact  Psychiatric:        Mood and Affect: Mood normal.        Thought Content: Thought content normal.    UC Treatments / Results  Labs (all labs ordered are listed, but only abnormal results are displayed) Labs Reviewed - No data to display  EKG  Radiology No results found.  Procedures Procedures (including critical care time)  Medications Ordered in UC Medications - No data to display  Initial Impression / Assessment and Plan / UC Course  I have reviewed the triage vital signs and the nursing notes.  Pertinent labs & imaging results that were available during my care of the patient were reviewed by me and considered in my medical decision making (see chart for details).     Given duration and worsening course of symptoms, treat  with Augmentin, Flonase, sinus rinses, supportive over-the-counter medications and home care.  Regarding her back pain, suspect muscular nature.  Trial anti-inflammatory pain medications, heat, stretches, massage, follow-up for worsening symptoms. Final  Clinical Impressions(s) / UC Diagnoses   Final diagnoses:  Acute non-recurrent maxillary sinusitis  Upper back pain     Discharge Instructions      For your sinuses, take the medications prescribed as well as doing warm saline sinus rinses and over-the-counter decongestants.  For your back pain, use heat, massage, ibuprofen and Tylenol    ED Prescriptions     Medication Sig Dispense Auth. Provider   amoxicillin-clavulanate (AUGMENTIN) 875-125 MG tablet Take 1 tablet by mouth every 12 (twelve) hours. 14 tablet Volney American, PA-C   fluticasone [facility]) 50 MCG/ACT nasal spray Place 1 spray into both nostrils 2 (two) times daily. 16 g Volney American, [place]      PDMP not reviewed this encounter.   Volney American, [place] 12/05/22 [phone]

## 2022-12-05 NOTE — Discharge Instructions (Signed)
For your sinuses, take the medications prescribed as well as doing warm saline sinus rinses and over-the-counter decongestants.  For your back pain, use heat, massage, ibuprofen and Tylenol

## 2023-01-28 ENCOUNTER — Emergency Department
Admission: EM | Admit: 2023-01-28 | Discharge: 2023-01-28 | Disposition: A | Discharge status: Home / Self Care | Payer: Self-pay | Attending: Emergency Medicine | Admitting: Emergency Medicine

## 2023-01-28 ENCOUNTER — Other Ambulatory Visit: Payer: Self-pay

## 2023-01-28 ENCOUNTER — Encounter: Payer: Self-pay | Admitting: Emergency Medicine

## 2023-01-28 ENCOUNTER — Emergency Department: Payer: Self-pay

## 2023-01-28 DIAGNOSIS — Z1152 Encounter for screening for COVID-19: Secondary | ICD-10-CM | POA: Insufficient documentation

## 2023-01-28 DIAGNOSIS — B349 Viral infection, unspecified: Secondary | ICD-10-CM | POA: Insufficient documentation

## 2023-01-28 LAB — COMPREHENSIVE METABOLIC PANEL
ALT: 11 U/L (ref 0–44)
AST: 15 U/L (ref 15–41)
Albumin: 3.5 g/dL (ref 3.5–5.0)
Alkaline Phosphatase: 49 U/L (ref 38–126)
Anion gap: 8 (ref 5–15)
BUN: 12 mg/dL (ref 8–23)
CO2: 24 mmol/L (ref 22–32)
Calcium: 8.3 mg/dL — ABNORMAL LOW (ref 8.9–10.3)
Chloride: 106 mmol/L (ref 98–111)
Creatinine, Ser: 0.84 mg/dL (ref 0.44–1.00)
GFR, Estimated: >60 mL/min (ref >60)
Glucose, Bld: 107 mg/dL — ABNORMAL HIGH (ref 70–99)
Potassium: 3.5 mmol/L (ref 3.5–5.1)
Sodium: 138 mmol/L (ref 135–145)
Total Bilirubin: 0.8 mg/dL (ref 0.3–1.2)
Total Protein: 6 g/dL — ABNORMAL LOW (ref 6.5–8.1)

## 2023-01-28 LAB — RESP PANEL BY RT-PCR (RSV, FLU A&B, COVID)  RVPGX2
Influenza A by PCR: NEGATIVE
Influenza B by PCR: NEGATIVE
Resp Syncytial Virus by PCR: NEGATIVE
SARS Coronavirus 2 by RT PCR: NEGATIVE

## 2023-01-28 LAB — GROUP A STREP BY PCR: Group A Strep by PCR: NOT DETECTED

## 2023-01-28 LAB — CBC
HCT: 35.9 % — ABNORMAL LOW (ref 36.0–46.0)
Hemoglobin: 12.2 g/dL (ref 12.0–15.0)
MCH: 29.7 pg (ref 26.0–34.0)
MCHC: 34 g/dL (ref 30.0–36.0)
MCV: 87.3 fL (ref 80.0–100.0)
Platelets: 167 10*3/uL (ref 150–400)
RBC: 4.11 MIL/uL (ref 3.87–5.11)
RDW: 13.2 % (ref 11.5–15.5)
WBC: 9.1 10*3/uL (ref 4.0–10.5)
nRBC: 0 % (ref 0.0–0.2)

## 2023-01-28 LAB — URINALYSIS, COMPLETE (UACMP) WITH MICROSCOPIC
Bilirubin Urine: NEGATIVE
Glucose, UA: NEGATIVE mg/dL
Ketones, ur: NEGATIVE mg/dL
Nitrite: NEGATIVE
Protein, ur: NEGATIVE mg/dL
Specific Gravity, Urine: 1.015 (ref 1.005–1.030)
pH: 5 (ref 5.0–8.0)

## 2023-01-28 LAB — LIPASE, BLOOD: Lipase: 32 U/L (ref 11–51)

## 2023-01-28 MED ORDER — SODIUM CHLORIDE 0.9 % IV BOLUS
1000.0000 mL | Freq: Once | INTRAVENOUS | Status: AC
  Administered 2023-01-28: 1000 mL via INTRAVENOUS

## 2023-01-28 MED ORDER — KETOROLAC TROMETHAMINE 30 MG/ML IJ SOLN
15.0000 mg | Freq: Once | INTRAMUSCULAR | Status: AC
  Administered 2023-01-28: 15 mg via INTRAVENOUS
  Filled 2023-01-28: qty 1

## 2023-01-28 MED ORDER — ONDANSETRON 4 MG PO TBDP: 4.0000 mg | ORAL_TABLET | Freq: Three times a day (TID) | ORAL | 0 refills | Status: DC | PRN

## 2023-01-28 MED ORDER — ONDANSETRON HCL 4 MG/2ML IJ SOLN
4.0000 mg | Freq: Once | INTRAMUSCULAR | Status: AC
  Administered 2023-01-28: 4 mg via INTRAVENOUS
  Filled 2023-01-28: qty 2

## 2023-01-28 NOTE — ED Provider Notes (Signed)
[facility] Provider Note    Event Date/Time   First MD Initiated Contact with Patient 01/28/23 [phone]     (approximate)  History   Chief Complaint: Sore Throat  HPI  Jillian Thomas is a 72 y.o. female with no significant past medical history presents to the emergency department for sore throat body aches chills and vomiting.  According to the patient last night she developed a sore throat and states multiple episodes of vomiting 3-4 episodes overnight along with subjective chills and bodyaches.  Denies any chest pain no cough no congestion.  States very minimal upper abdominal discomfort from vomiting.  Denies any urinary symptoms.  Physical Exam   Triage Vital Signs: ED Triage Vitals  Enc Vitals Group     BP 01/28/23 0847 (!) 150/109     Pulse Rate 01/28/23 0847 (!) 103     Resp 01/28/23 0847 18     Temp 01/28/23 0847 98.4 F (36.9 C)     Temp Source 01/28/23 0847 Oral     SpO2 01/28/23 0847 90 %     Weight 01/28/23 0847 190 lb (86.2 kg)     Height 01/28/23 0847 5\' 3"  (1.6 m)     Head Circumference --      Peak Flow --      Pain Score 01/28/23 0850 7     Pain Loc --      Pain Edu? --      Excl. in [place]? --     Most recent vital signs: Vitals:   01/28/23 0847 01/28/23 0853  BP: (!) 150/109   Pulse: (!) 103   Resp: 18   Temp: 98.4 F (36.9 C)   SpO2: 90% 94%    General: Awake, no distress.  CV:  Good peripheral perfusion.  Regular rate and rhythm  Resp:  Normal effort.  Equal breath sounds bilaterally.  Abd:  No distention.  Soft, slight epigastric tenderness otherwise benign abdomen no rebound or guarding. Other:  Mild pharyngeal erythema without exudate or tonsillar hypertrophy.  No cervical lymphadenopathy.   ED Results / Procedures / Treatments   RADIOLOGY  I have reviewed and interpreted chest x-ray images.  No consolidation seen on my evaluation. Radiology is read the x-ray as negative.   MEDICATIONS ORDERED IN  ED: Medications  sodium chloride 0.9 % bolus 1,000 mL (has no administration in time range)  ondansetron (ZOFRAN) injection 4 mg (has no administration in time range)     IMPRESSION / MDM / ASSESSMENT AND PLAN / ED COURSE  I reviewed the triage vital signs and the nursing notes.  Patient's presentation is most consistent with acute presentation with potential threat to life or bodily function.  Patient presents emergency department for nausea vomiting and sore throat.  Patient states the sore throat started first followed by nausea and vomiting last night now with subjective fever/chills and bodyaches today.  Denies any cough or congestion.  No chest pain.  We will check labs, we will obtain a COVID and strep swab.  We will obtain a urinalysis.  We will IV hydrate treat nausea and continue to closely monitor while awaiting results.  Patient agreeable to plan.  Patient's urinalysis shows rare bacteria no white cells or red cells and patient has no urinary symptoms.  Patient's COVID and flu as well as RSV are negative, strep is negative.  Patient's blood work unfortunately has been lost twice per lab and they are redrawing the patient's blood work to  recheck it.  Patient has been in the emergency department 4 and half hours at this time.  Patient's chest x-ray does show mild edema versus viral infection.  Awaiting lab work results.  Patient's lab work is finally resulted reassuring CBC with a normal white blood cell count, normal chemistry, normal lipase.  Urinalysis is normal as well.  Given the patient's reassuring workup I highly suspect more of a viral syndrome given the nausea and sore throat.  Denies any chest pain or shortness of breath.  No known fever.  Discussed supportive care including Tylenol or ibuprofen as needed for fever/discomfort over-the-counter lozenges or sprays for her sore throat and PCP follow-up.  Patient agreeable to plan of care.  FINAL CLINICAL IMPRESSION(S) / ED  DIAGNOSES   Viral syndrome  Note:  This document was prepared using Dragon voice recognition software and may include unintentional dictation errors.   Harvest Dark, MD 01/28/23 1423

## 2023-01-28 NOTE — ED Notes (Signed)
RN called lab about pt blood results. Lab sts " I do not have the specimen." RN asked lab to come and recollect blood.

## 2023-01-28 NOTE — ED Notes (Signed)
Pt labs recollected and sent to the lab by Romilda Joy, EMT-P.

## 2023-01-28 NOTE — ED Notes (Signed)
RN called to inquire about pt lab results, per lab, they do not have any blood.

## 2023-01-28 NOTE — ED Triage Notes (Signed)
Patient to ED with sore throat and chills started last night. Patient states she has also vomited.

## 2023-01-30 ENCOUNTER — Other Ambulatory Visit: Payer: Self-pay

## 2023-01-30 ENCOUNTER — Emergency Department: Payer: Self-pay

## 2023-01-30 ENCOUNTER — Emergency Department
Admission: EM | Admit: 2023-01-30 | Discharge: 2023-01-30 | Disposition: A | Discharge status: Home / Self Care | Payer: Self-pay | Attending: Emergency Medicine | Admitting: Emergency Medicine

## 2023-01-30 DIAGNOSIS — J040 Acute laryngitis: Secondary | ICD-10-CM | POA: Insufficient documentation

## 2023-01-30 LAB — GROUP A STREP BY PCR: Group A Strep by PCR: NOT DETECTED

## 2023-01-30 MED ORDER — METHYLPREDNISOLONE 4 MG PO TBPK: ORAL_TABLET | ORAL | 0 refills | Status: DC

## 2023-01-30 NOTE — ED Triage Notes (Signed)
Pt presents to ED with c/o of sore throat, pt states she was seen Wed for same. Pt states all viral swabs were negative. Pt states voice more hoarse. Pt denies fevers or chills.

## 2023-01-30 NOTE — ED Provider Notes (Signed)
[facility] Provider Note    Event Date/Time   First MD Initiated Contact with Patient 01/30/23 [phone]     (approximate)   History   Sore Throat   HPI  Jillian Thomas is a 72 y.o. female with no significant past medical history presents emergency department complaining of sore throat, hoarse voice and feeling like she is wheezing in the left side of her neck.  No fever or chills.  No chest pain or shortness of breath.  Symptoms been going on for 3 to 4 days      Physical Exam   Triage Vital Signs: ED Triage Vitals  Enc Vitals Group     BP 01/30/23 0843 (!) 138/96     Pulse Rate 01/30/23 0843 87     Resp 01/30/23 0843 18     Temp 01/30/23 0843 98 F (36.7 C)     Temp Source 01/30/23 0843 Oral     SpO2 01/30/23 0843 95 %     Weight --      Height --      Head Circumference --      Peak Flow --      Pain Score 01/30/23 0841 5     Pain Loc --      Pain Edu? --      Excl. in [place]? --     Most recent vital signs: Vitals:   01/30/23 0843  BP: (!) 138/96  Pulse: 87  Resp: 18  Temp: 98 F (36.7 C)  SpO2: 95%     General: Awake, no distress.   CV:  Good peripheral perfusion. regular rate and  rhythm Resp:  Normal effort. Lungs cta Abd:  No distention.   Other:  No crepitus noticed at the neck, neck is supple, no lymphadenopathy, voice is hoarse   ED Results / Procedures / Treatments   Labs (all labs ordered are listed, but only abnormal results are displayed) Labs Reviewed  GROUP A STREP BY PCR     EKG     RADIOLOGY Soft tissue of the neck x-ray    PROCEDURES:   Procedures   MEDICATIONS ORDERED IN ED: Medications - No data to display   IMPRESSION / MDM / [place] / ED COURSE  I reviewed the triage vital signs and the nursing notes.                              Differential diagnosis includes, but is not limited to, epiglottis, laryngitis, pharyngitis, COVID, influenza, strep throat  Patient's  presentation is most consistent with acute complicated illness / injury requiring diagnostic workup.   The patient had a large workup done the other day.  She was negative for COVID, influenza.  Strep test negative.  Strep test on repeat today still negative.  Will do a x-ray of the soft tissue of the neck to assess for epiglottitis or free air.   X-ray soft tissue of the neck independently reviewed interpreted by me as being negative for any acute abnormality.  Patient already has an appoint with the ENT and she should keep this appointment follow-up.  She was given a prescription for Medrol Dosepak to see if this will help decrease any nodules that may be present.  She is in agreement treatment plan.  Discharged stable condition.   FINAL CLINICAL IMPRESSION(S) / ED DIAGNOSES   Final diagnoses:  Laryngitis     Rx /  DC Orders   ED Discharge Orders          Ordered    methylPREDNISolone (MEDROL DOSEPAK) 4 MG TBPK tablet        01/30/23 1005             Note:  This document was prepared using Dragon voice recognition software and may include unintentional dictation errors.    Versie Starks, PA-C 01/30/23 1006    Blake Divine, MD 02/01/23 [phone]

## 2023-08-02 ENCOUNTER — Other Ambulatory Visit: Payer: Self-pay

## 2023-08-02 ENCOUNTER — Emergency Department
Admission: EM | Admit: 2023-08-02 | Discharge: 2023-08-02 | Disposition: A | Discharge status: Home / Self Care | Payer: Self-pay | Attending: Emergency Medicine | Admitting: Emergency Medicine

## 2023-08-02 DIAGNOSIS — M25431 Effusion, right wrist: Secondary | ICD-10-CM

## 2023-08-02 DIAGNOSIS — M7989 Other specified soft tissue disorders: Secondary | ICD-10-CM | POA: Insufficient documentation

## 2023-08-02 NOTE — ED Triage Notes (Signed)
Pt arrives via POV with CC of possible allergic reaction to insect bite since Friday (2 days ago). Pt has small puncture mark to R posterior wrist with associated swelling and itching. Pt able to move all digits appropriately.

## 2023-08-02 NOTE — ED Provider Notes (Signed)
   [facility] Provider Note    Event Date/Time   First MD Initiated Contact with Patient 08/02/23 [phone]     (approximate)   History   Allergic Reaction   HPI  Jillian Thomas is a 72 y.o. female who presents with some swelling to her right wrist.  Patient reports that she think she was bit by an insect on Friday, she had significant swelling which is now greatly improved.  No redness or pain.  She reports her daughter was very concerned and wanted her to get checked out.     Physical Exam   Triage Vital Signs: ED Triage Vitals  Encounter Vitals Group     BP 08/02/23 2217 (!) 144/91     Systolic BP Percentile --      Diastolic BP Percentile --      Pulse Rate 08/02/23 2217 73     Resp 08/02/23 2214 19     Temp 08/02/23 2217 99 F (37.2 C)     Temp Source 08/02/23 2217 Oral     SpO2 08/02/23 2217 99 %     Weight 08/02/23 2216 85.7 kg (189 lb)     Height 08/02/23 2216 1.524 m (5')     Head Circumference --      Peak Flow --      Pain Score 08/02/23 2215 0     Pain Loc --      Pain Education --      Exclude from Growth Chart --     Most recent vital signs: Vitals:   08/02/23 2214 08/02/23 2217  BP:  (!) 144/91  Pulse:  73  Resp: 19   Temp:  99 F (37.2 C)  SpO2:  99%     General: Awake, no distress.  CV:  Good peripheral perfusion.  Resp:  Normal effort.  Abd:  No distention.  Other:  Right wrist, minimal swelling, normal range of motion, no evidence of infection   ED Results / Procedures / Treatments   Labs (all labs ordered are listed, but only abnormal results are displayed) Labs Reviewed - No data to display   EKG     RADIOLOGY     PROCEDURES:  Critical Care performed:   Procedures   MEDICATIONS ORDERED IN ED: Medications - No data to display   IMPRESSION / MDM / ASSESSMENT AND PLAN / ED COURSE  I reviewed the triage vital signs and the nursing notes. Patient's presentation is most consistent with  acute, uncomplicated illness.  Very reassuring exam, no evidence of infection, only minimal swelling, suspect inflammatory reaction related to insect bite, supportive care, ice as needed, outpatient follow-up        FINAL CLINICAL IMPRESSION(S) / ED DIAGNOSES   Final diagnoses:  Swelling of right wrist     Rx / DC Orders   ED Discharge Orders     None        Note:  This document was prepared using Dragon voice recognition software and may include unintentional dictation errors.   Jene Every, MD 08/02/23 [phone]

## 2023-08-02 NOTE — Discharge Instructions (Addendum)
There are no signs of infection. Please continue to ice your wrist as we discussed

## 2023-09-30 ENCOUNTER — Other Ambulatory Visit: Payer: Self-pay

## 2023-09-30 ENCOUNTER — Emergency Department
Admission: EM | Admit: 2023-09-30 | Discharge: 2023-09-30 | Disposition: A | Discharge status: Home / Self Care | Payer: Self-pay | Attending: Emergency Medicine | Admitting: Emergency Medicine

## 2023-09-30 DIAGNOSIS — J069 Acute upper respiratory infection, unspecified: Secondary | ICD-10-CM | POA: Insufficient documentation

## 2023-09-30 LAB — RESP PANEL BY RT-PCR (RSV, FLU A&B, COVID)  RVPGX2
Influenza A by PCR: NEGATIVE
Influenza B by PCR: NEGATIVE
Resp Syncytial Virus by PCR: NEGATIVE
SARS Coronavirus 2 by RT PCR: NEGATIVE

## 2023-09-30 LAB — SARS CORONAVIRUS 2 BY RT PCR: SARS Coronavirus 2 by RT PCR: NEGATIVE

## 2023-09-30 MED ORDER — FLUTICASONE PROPIONATE 50 MCG/ACT NA SUSP: 1.0000 | Freq: Two times a day (BID) | NASAL | 2 refills | Status: DC

## 2023-09-30 MED ORDER — BENZONATATE 100 MG PO CAPS: ORAL_CAPSULE | ORAL | 0 refills | Status: DC

## 2023-09-30 NOTE — Discharge Instructions (Addendum)
Take the prescription meds as directed. Take OTC Delsym (dextromethorphan) for cough relief.

## 2023-09-30 NOTE — ED Triage Notes (Signed)
Pt to ED for body aches, sore throat, cough started Monday.

## 2023-09-30 NOTE — ED Provider Notes (Signed)
[facility] Emergency Department Provider Note     Event Date/Time   First MD Initiated Contact with Patient 09/30/23 1827     (approximate)   History   No chief complaint on file.   HPI  Jillian Thomas is a 72 y.o. female with a history of palpitations, obesity, chronic sore throat, and dysphonia, presents to the ED for evaluation of cold-like symptoms.  Patient would endorse symptoms including body aches, sore throat and cough started on Monday, 2 days prior to presentation.  No reports of any fevers, chills, chest pain, or shortness of breath.     Physical Exam   Triage Vital Signs: ED Triage Vitals  Encounter Vitals Group     BP 09/30/23 1609 (!) 143/97     Systolic BP Percentile --      Diastolic BP Percentile --      Pulse Rate 09/30/23 1609 79     Resp 09/30/23 1609 18     Temp 09/30/23 1609 98.5 F (36.9 C)     Temp src --      SpO2 09/30/23 1611 93 %     Weight 09/30/23 1610 180 lb (81.6 kg)     Height 09/30/23 1610 5' (1.524 m)     Head Circumference --      Peak Flow --      Pain Score 09/30/23 1609 7     Pain Loc --      Pain Education --      Exclude from Growth Chart --     Most recent vital signs: Vitals:   09/30/23 1609 09/30/23 1611  BP: (!) 143/97   Pulse: 79   Resp: 18   Temp: 98.5 F (36.9 C)   SpO2:  93%    General Awake, no distress. NAD HEENT NCAT. PERRL. EOMI. No rhinorrhea. Mucous membranes are moist.  Uvula is midline tonsils are flat. CV:  Good peripheral perfusion. RRR RESP:  Normal effort. CTA.  No wheeze, rales, or rhonchi noted.  No cough on exam. ABD:  No distention.    ED Results / Procedures / Treatments   Labs (all labs ordered are listed, but only abnormal results are displayed) Labs Reviewed  SARS CORONAVIRUS 2 BY RT PCR  RESP PANEL BY RT-PCR (RSV, FLU A&B, COVID)  RVPGX2     EKG   RADIOLOGY   No results found.   PROCEDURES:  Critical Care performed:  No  Procedures   MEDICATIONS ORDERED IN ED: Medications - No data to display   IMPRESSION / MDM / ASSESSMENT AND PLAN / ED COURSE  I reviewed the triage vital signs and the nursing notes.                              Differential diagnosis includes, but is not limited to, COVID, flu, RSV, viral URI  Patient's presentation is most consistent with acute complicated illness / injury requiring diagnostic workup.  Patient's diagnosis is consistent with viral URI.  Patient with a reassuring exam and workup at this time.  No respiratory distress on exam.  COVID PCR is negative at this time, flu/RSV is pending.  Patient will be discharged home with prescriptions for [facility] and Flonase.  Additionally, she is advised to take OTC Delsym for additional cough relief.  Patient will monitor pending viral panel results on: MyChart.  Patient is to follow up with her PCP as discussed,  as needed or otherwise directed. Patient is given ED precautions to return to the ED for any worsening or new symptoms.     FINAL CLINICAL IMPRESSION(S) / ED DIAGNOSES   Final diagnoses:  Viral URI     Rx / DC Orders   ED Discharge Orders          Ordered    fluticasone (FLONASE) 50 MCG/ACT nasal spray  2 times daily        09/30/23 1837    benzonatate (TESSALON PERLES) 100 MG capsule        09/30/23 1837             Note:  This document was prepared using Dragon voice recognition software and may include unintentional dictation errors.    Lissa Hoard, PA-C 09/30/23 1904    Trinna Post, MD 10/01/23 [phone]

## 2023-11-20 ENCOUNTER — Other Ambulatory Visit: Payer: Self-pay

## 2023-11-20 DIAGNOSIS — Z1231 Encounter for screening mammogram for malignant neoplasm of breast: Secondary | ICD-10-CM

## 2023-12-21 ENCOUNTER — Ambulatory Visit
Admission: RE | Admit: 2023-12-21 | Discharge: 2023-12-21 | Disposition: A | Discharge status: Home / Self Care | Payer: Self-pay | Source: Ambulatory Visit | Attending: Obstetrics and Gynecology | Admitting: Obstetrics and Gynecology

## 2023-12-21 ENCOUNTER — Ambulatory Visit: Payer: Self-pay | Attending: Hematology and Oncology | Admitting: *Deleted

## 2023-12-21 VITALS — BP 138/82 | Wt 183.5 lb

## 2023-12-21 DIAGNOSIS — Z1231 Encounter for screening mammogram for malignant neoplasm of breast: Secondary | ICD-10-CM | POA: Insufficient documentation

## 2023-12-21 DIAGNOSIS — Z1239 Encounter for other screening for malignant neoplasm of breast: Secondary | ICD-10-CM

## 2023-12-21 NOTE — Patient Instructions (Addendum)
 Explained breast self awareness with Jillian Thomas. Patient did not need a Pap smear today due to patient is over 72 years old with no history of an abnormal Pap smear per ASCCP guidelines. Referred patient to the [facility] for a screening mammogram. Appointment scheduled Monday, December 21, 2023 at 1420. Patient aware of appointment and will be there. Let patient know Tony Frederickson will follow up with her within the next couple weeks with results of mammogram by letter or phone. Jillian Thomas verbalized understanding.  Jillian Thomas, Dela Favor, RN 1:37 PM

## 2023-12-21 NOTE — Progress Notes (Signed)
 Ms. Jillian Thomas is a 73 y.o. female who presents to [facility] clinic today with no complaints.    Pap Smear: Pap smear not completed today. Last Pap smear was 09/14/2019 at [facility] clinic and was normal. Per patient has no history of an abnormal Pap smear. Last Pap smear result is available in Epic.   Physical exam: Breasts Left breast is slightly larger than right breast that per patient she has not noticed any changes. No skin abnormalities bilateral breasts. No nipple retraction bilateral breasts. No nipple discharge bilateral breasts. No lymphadenopathy. No lumps palpated bilateral breasts. No complaints of pain or tenderness on exam.      Pelvic/Bimanual Pap is not indicated today per BCCCP guidelines.   Smoking History: Patient has never smoked.   Patient Navigation: Patient education provided. Access to services provided for patient through BCCCP program.  Colorectal Cancer Screening: Per patient has a history of a colonoscopy in 2019. Patient stated she completed the cologuard in December 2024 that was negative given by her PCP at [facility]. No complaints today.    Breast and Cervical Cancer Risk Assessment: Patient does not have family history of breast cancer, known genetic mutations, or radiation treatment to the chest before age 24. Patient does not have history of cervical dysplasia, immunocompromised, or DES exposure in-utero.  Risk Scores as of Encounter on 12/21/2023     Gregary Lean           5-year 1.26%   Lifetime 3.5%            Last calculated by Algie Ingle, LPN on 1/61/0960 at  1:32 PM        A: BCCCP exam without pap smear No complaints.  P: Referred patient to the [facility] for a screening mammogram. Appointment scheduled Monday, December 21, 2023 at 1420.  Stefan Edge, RN 12/21/2023 1:37 PM

## 2023-12-22 ENCOUNTER — Other Ambulatory Visit: Payer: Self-pay | Admitting: *Deleted

## 2023-12-22 ENCOUNTER — Inpatient Hospital Stay
Admission: RE | Admit: 2023-12-22 | Discharge: 2023-12-22 | Disposition: A | Discharge status: Home / Self Care | Payer: Self-pay | Source: Ambulatory Visit | Attending: Physician Assistant | Admitting: Physician Assistant

## 2023-12-22 DIAGNOSIS — Z1231 Encounter for screening mammogram for malignant neoplasm of breast: Secondary | ICD-10-CM

## 2024-09-17 ENCOUNTER — Other Ambulatory Visit: Payer: Self-pay

## 2024-09-17 ENCOUNTER — Emergency Department
Admission: EM | Admit: 2024-09-17 | Discharge: 2024-09-17 | Disposition: A | Discharge status: Home / Self Care | Payer: Self-pay | Attending: Emergency Medicine | Admitting: Emergency Medicine

## 2024-09-17 ENCOUNTER — Emergency Department: Payer: Self-pay

## 2024-09-17 DIAGNOSIS — N390 Urinary tract infection, site not specified: Secondary | ICD-10-CM | POA: Insufficient documentation

## 2024-09-17 DIAGNOSIS — R1085 Abdominal pain of multiple sites: Secondary | ICD-10-CM

## 2024-09-17 LAB — COMPREHENSIVE METABOLIC PANEL WITH GFR
ALT: 15 U/L (ref 0–44)
AST: 21 U/L (ref 15–41)
Albumin: 3.9 g/dL (ref 3.5–5.0)
Alkaline Phosphatase: 64 U/L (ref 38–126)
Anion gap: 12 (ref 5–15)
BUN: 20 mg/dL (ref 8–23)
CO2: 22 mmol/L (ref 22–32)
Calcium: 9.3 mg/dL (ref 8.9–10.3)
Chloride: 103 mmol/L (ref 98–111)
Creatinine, Ser: 0.86 mg/dL (ref 0.44–1.00)
GFR, Estimated: >60 mL/min (ref >60)
Glucose, Bld: 109 mg/dL — ABNORMAL HIGH (ref 70–99)
Potassium: 3.9 mmol/L (ref 3.5–5.1)
Sodium: 137 mmol/L (ref 135–145)
Total Bilirubin: 0.7 mg/dL (ref 0.0–1.2)
Total Protein: 7.2 g/dL (ref 6.5–8.1)

## 2024-09-17 LAB — URINALYSIS, ROUTINE W REFLEX MICROSCOPIC
Bilirubin Urine: NEGATIVE
Glucose, UA: NEGATIVE mg/dL
Ketones, ur: NEGATIVE mg/dL
Nitrite: NEGATIVE
Protein, ur: NEGATIVE mg/dL
Specific Gravity, Urine: 1.003 — ABNORMAL LOW (ref 1.005–1.030)
pH: 6 (ref 5.0–8.0)

## 2024-09-17 LAB — CBC
HCT: 40.2 % (ref 36.0–46.0)
Hemoglobin: 13.7 g/dL (ref 12.0–15.0)
MCH: 30.1 pg (ref 26.0–34.0)
MCHC: 34.1 g/dL (ref 30.0–36.0)
MCV: 88.4 fL (ref 80.0–100.0)
Platelets: 220 K/uL (ref 150–400)
RBC: 4.55 MIL/uL (ref 3.87–5.11)
RDW: 13.2 % (ref 11.5–15.5)
WBC: 8.9 K/uL (ref 4.0–10.5)
nRBC: 0 % (ref 0.0–0.2)

## 2024-09-17 LAB — LIPASE, BLOOD: Lipase: 49 U/L (ref 11–51)

## 2024-09-17 MED ORDER — CEFDINIR 300 MG PO CAPS: 300.0000 mg | ORAL_CAPSULE | Freq: Two times a day (BID) | ORAL | 0 refills | Status: AC

## 2024-09-17 MED ORDER — SODIUM CHLORIDE 0.9 % IV SOLN
1.0000 g | Freq: Once | INTRAVENOUS | Status: AC
  Administered 2024-09-17: 1 g via INTRAVENOUS
  Filled 2024-09-17: qty 10

## 2024-09-17 MED ORDER — SODIUM CHLORIDE 0.9 % IV BOLUS
1000.0000 mL | Freq: Once | INTRAVENOUS | Status: AC
  Administered 2024-09-17: 1000 mL via INTRAVENOUS

## 2024-09-17 MED ORDER — ONDANSETRON HCL 4 MG/2ML IJ SOLN
4.0000 mg | Freq: Once | INTRAMUSCULAR | Status: AC
  Administered 2024-09-17: 4 mg via INTRAVENOUS
  Filled 2024-09-17: qty 2

## 2024-09-17 MED ORDER — IOHEXOL 300 MG/ML  SOLN
100.0000 mL | Freq: Once | INTRAMUSCULAR | Status: AC | PRN
  Administered 2024-09-17: 100 mL via INTRAVENOUS

## 2024-09-17 MED ORDER — KETOROLAC TROMETHAMINE 15 MG/ML IJ SOLN
15.0000 mg | Freq: Once | INTRAMUSCULAR | Status: AC
  Administered 2024-09-17: 15 mg via INTRAVENOUS
  Filled 2024-09-17: qty 1

## 2024-09-17 NOTE — ED Provider Notes (Signed)
 Jillian Thomas Provider Note    Event Date/Time   First MD Initiated Contact with Patient 09/17/24 0413     (approximate)   History   Abdominal Pain and Back Pain   HPI  Jillian Thomas is a 73 y.o. female with history of hyperlipidemia, history of cholecystectomy, tubal ligation, presenting with right upper quadrant and right lower quadrant abdominal pain.  Says she also has some right lower back aching.  Has been ongoing for a week, does note dysuria.  Does have nausea but no vomiting or diarrhea, no fever.  No recent trauma or falls.   On independent chart review, she has had a CT in the past in 2023 for flank pain that did not show any acute pathology.  She has minimal bilateral nonobstructing nephrolithiasis noted in her kidneys.     Physical Exam   Triage Vital Signs: ED Triage Vitals  Encounter Vitals Group     BP 09/17/24 0131 (!) 166/95     Girls Systolic BP Percentile --      Girls Diastolic BP Percentile --      Boys Systolic BP Percentile --      Boys Diastolic BP Percentile --      Pulse Rate 09/17/24 0131 77     Resp 09/17/24 0131 18     Temp 09/17/24 0131 97.7 F (36.5 C)     Temp Source 09/17/24 0131 Oral     SpO2 09/17/24 0131 96 %     Weight 09/17/24 0132 180 lb (81.6 kg)     Height 09/17/24 0132 5' (1.524 m)     Head Circumference --      Peak Flow --      Pain Score 09/17/24 0131 8     Pain Loc --      Pain Education --      Exclude from Growth Chart --     Most recent vital signs: Vitals:   09/17/24 0131 09/17/24 0415  BP: (!) 166/95 135/79  Pulse: 77 65  Resp: 18 18  Temp: 97.7 F (36.5 C) 98.1 F (36.7 C)  SpO2: 96% 96%     General: Awake, no distress.  CV:  Good peripheral perfusion.  Resp:  Normal effort.  Abd:  No distention.  Soft, nontender Other:  No CVA tenderness bilaterally, no midline spinal tenderness, no flank tenderness.   ED Results / Procedures / Treatments   Labs (all labs ordered are  listed, but only abnormal results are displayed) Labs Reviewed  COMPREHENSIVE METABOLIC PANEL WITH GFR - Abnormal; Notable for the following components:      Result Value   Glucose, Bld 109 (*)    All other components within normal limits  URINALYSIS, ROUTINE W REFLEX MICROSCOPIC - Abnormal; Notable for the following components:   Color, Urine STRAW (*)    APPearance CLEAR (*)    Specific Gravity, Urine 1.003 (*)    Hgb urine dipstick SMALL (*)    Leukocytes,Ua LARGE (*)    Bacteria, UA RARE (*)    All other components within normal limits  LIPASE, BLOOD  CBC     RADIOLOGY On my independent interpretation, CT without obvious hydronephrosis   PROCEDURES:  Critical Care performed: No  Procedures   MEDICATIONS ORDERED IN ED: Medications  iohexol  (OMNIPAQUE ) 300 MG/ML solution 100 mL (100 mLs Intravenous Contrast Given 09/17/24 0341)  sodium chloride  0.9 % bolus 1,000 mL (1,000 mLs Intravenous New Bag/Given 09/17/24 0446)  ondansetron  (  ZOFRAN ) injection 4 mg (4 mg Intravenous Given 09/17/24 0447)  ketorolac  (TORADOL ) 15 MG/ML injection 15 mg (15 mg Intravenous Given 09/17/24 0447)  cefTRIAXone  (ROCEPHIN ) 1 g in sodium chloride  0.9 % 100 mL IVPB (1 g Intravenous New Bag/Given 09/17/24 0452)     IMPRESSION / MDM / ASSESSMENT AND PLAN / ED COURSE  I reviewed the triage vital signs and the nursing notes.                              Differential diagnosis includes, but is not limited to, colitis, diverticulitis, appendicitis, nephrolithiasis, pyelonephritis, UTI, IBS, IBD.  Musculoskeletal pain, strain.  Get labs, CT, UA.  IV fluids, Toradol , Zofran .  Patient's presentation is most consistent with acute presentation with potential threat to life or bodily function.  Independent interpretation of labs and imaging below.  On reassessment patient is feeling a lot better, discussed with her about imaging and lab results including incidental findings, she has no shortness of breath at  this time or chest pain.  She was given initial dose of IV antibiotics for UTI.  Will prescribe her antibiotics outpatient.  Did discuss with her about following up with primary care next week to get reassessed.  She can take Tylenol  as needed for her pain.  Otherwise considered but no indication for inpatient admission at this time, she is safe for outpatient management.  Will discharge with strict return precautions.  Shared decision making done with patient and she is agreeable with the plan.   Clinical Course as of 09/17/24 0555  Sat Sep 17, 2024  9560 Independent review of labs, electrolytes not severely deranged, LFTs normal, no leukocytosis, lipase is normal, UA shows large leukocytes, WBCs and rare bacteria, will treat her for UTI. [TT]  0550 CT ABDOMEN PELVIS W CONTRAST IMPRESSION: 1. No acute findings in the abdomen or pelvis. 2. Mild interstitial thickening and peripheral ground-glass attenuation in the lung bases, possibly reflecting early interstitial edema.   [TT]    Clinical Course User Index [TT] Waymond Lorelle Cummins, MD     FINAL CLINICAL IMPRESSION(S) / ED DIAGNOSES   Final diagnoses:  Urinary tract infection without hematuria, site unspecified  Abdominal pain of multiple sites     Rx / DC Orders   ED Discharge Orders          Ordered    cefdinir  (OMNICEF ) 300 MG capsule  2 times daily        09/17/24 0553             Note:  This document was prepared using Dragon voice recognition software and may include unintentional dictation errors.    Waymond Lorelle Cummins, MD 09/17/24 [phone]

## 2024-09-17 NOTE — ED Triage Notes (Signed)
 Patient ambulatory to triage with complaints of RL and RUQ abdominal pain and bilateral flank pain x1 week. Patient also endorses some dysuria. Nausea, no vomiting or diarrhea.

## 2024-09-17 NOTE — Discharge Instructions (Signed)
 Please take the antibiotics as prescribed for UTI.  Please make sure to follow-up with primary care doctor next week to get reassessed.

## 2024-11-06 ENCOUNTER — Other Ambulatory Visit: Payer: Self-pay

## 2024-11-06 ENCOUNTER — Ambulatory Visit: Payer: Self-pay

## 2024-11-06 ENCOUNTER — Encounter: Payer: Self-pay | Admitting: Emergency Medicine

## 2024-11-06 ENCOUNTER — Emergency Department
Admission: EM | Admit: 2024-11-06 | Discharge: 2024-11-06 | Disposition: A | Discharge status: Home / Self Care | Payer: Self-pay | Attending: Emergency Medicine | Admitting: Emergency Medicine

## 2024-11-06 DIAGNOSIS — J111 Influenza due to unidentified influenza virus with other respiratory manifestations: Secondary | ICD-10-CM | POA: Insufficient documentation

## 2024-11-06 LAB — RESP PANEL BY RT-PCR (RSV, FLU A&B, COVID)  RVPGX2
Influenza A by PCR: NEGATIVE
Influenza B by PCR: NEGATIVE
Resp Syncytial Virus by PCR: NEGATIVE
SARS Coronavirus 2 by RT PCR: NEGATIVE

## 2024-11-06 MED ORDER — ONDANSETRON 4 MG PO TBDP: 4.0000 mg | ORAL_TABLET | Freq: Three times a day (TID) | ORAL | 0 refills | Status: DC | PRN

## 2024-11-06 MED ORDER — OSELTAMIVIR PHOSPHATE 75 MG PO CAPS: 75.0000 mg | ORAL_CAPSULE | Freq: Two times a day (BID) | ORAL | 0 refills | Status: AC

## 2024-11-06 NOTE — ED Provider Notes (Signed)
 "  [facility] Provider Note    Event Date/Time   First MD Initiated Contact with Patient 11/06/24 1603     (approximate)   History   Generalized Body Aches   HPI  Jillian Thomas is a 73 y.o. female with no significant past medical history presents emergency department complaint of flulike symptoms that started 3 AM this morning.  Body aches, congestion, chills.  No vomiting or diarrhea.  No chest pain or shortness of breath.  No UTI symptoms      Physical Exam   Triage Vital Signs: ED Triage Vitals  Encounter Vitals Group     BP 11/06/24 1529 139/79     Girls Systolic BP Percentile --      Girls Diastolic BP Percentile --      Boys Systolic BP Percentile --      Boys Diastolic BP Percentile --      Pulse Rate 11/06/24 1528 89     Resp 11/06/24 1528 16     Temp 11/06/24 1528 98.1 F (36.7 C)     Temp Source 11/06/24 1528 Oral     SpO2 11/06/24 1528 95 %     Weight 11/06/24 1528 179 lb 14.3 oz (81.6 kg)     Height 11/06/24 1528 5' (1.524 m)     Head Circumference --      Peak Flow --      Pain Score 11/06/24 1528 6     Pain Loc --      Pain Education --      Exclude from Growth Chart --     Most recent vital signs: Vitals:   11/06/24 1528 11/06/24 1529  BP:  139/79  Pulse: 89   Resp: 16   Temp: 98.1 F (36.7 C)   SpO2: 95%      General: Awake, no distress.   CV:  Good peripheral perfusion. regular rate and  rhythm Resp:  Normal effort. Lungs cta Abd:  No distention.   Other:      ED Results / Procedures / Treatments   Labs (all labs ordered are listed, but only abnormal results are displayed) Labs Reviewed  RESP PANEL BY RT-PCR (RSV, FLU A&B, COVID)  RVPGX2     EKG     RADIOLOGY     PROCEDURES:   Procedures  Critical Care:  no Chief Complaint  Patient presents with   Generalized Body Aches      MEDICATIONS ORDERED IN ED: Medications - No data to display   IMPRESSION / MDM / ASSESSMENT AND PLAN  / ED COURSE  I reviewed the triage vital signs and the nursing notes.                              Differential diagnosis includes, but is not limited to, COVID, influenza, RSV, viral URI  Patient's presentation is most consistent with acute illness / injury with system symptoms.   The patient's exam is most consistent with flulike illness.  Patient would like for me to call her back with the results.  Prescription for Tamiflu  sent to the pharmacy.  Respiratory panel is reassuring.  However since patient's symptoms started at 3 AM she most likely does have the flu and is just not testing positive at this time.  I did call her to notify her of this finding.  She is to follow-up with her regular doctor.  Return if worsening.  Discharged stable condition      FINAL CLINICAL IMPRESSION(S) / ED DIAGNOSES   Final diagnoses:  Influenza     Rx / DC Orders   ED Discharge Orders          Ordered    oseltamivir  (TAMIFLU ) 75 MG capsule  2 times daily        11/06/24 1604             Note:  This document was prepared using Dragon voice recognition software and may include unintentional dictation errors.    Gasper Devere ORN, PA-C 11/06/24 1657    Jossie Artist POUR, MD 11/06/24 [phone]  "

## 2024-11-06 NOTE — ED Triage Notes (Signed)
 Pt reports flu symptoms starting around 3 am. Endorses pain all over and congestion and feeling cold.

## 2024-12-02 ENCOUNTER — Ambulatory Visit
Admission: RE | Admit: 2024-12-02 | Discharge: 2024-12-02 | Disposition: A | Attending: Physician Assistant | Admitting: Physician Assistant

## 2024-12-02 ENCOUNTER — Other Ambulatory Visit: Payer: Self-pay | Admitting: Physician Assistant

## 2024-12-02 ENCOUNTER — Ambulatory Visit
Admission: RE | Admit: 2024-12-02 | Discharge: 2024-12-02 | Disposition: A | Source: Ambulatory Visit | Attending: Physician Assistant | Admitting: Physician Assistant

## 2024-12-02 DIAGNOSIS — M25551 Pain in right hip: Secondary | ICD-10-CM

## 2024-12-15 ENCOUNTER — Ambulatory Visit

## 2024-12-15 VITALS — BP 116/82 | HR 83 | Ht 61.0 in | Wt 183.0 lb

## 2024-12-15 DIAGNOSIS — I2510 Atherosclerotic heart disease of native coronary artery without angina pectoris: Secondary | ICD-10-CM | POA: Diagnosis not present

## 2024-12-15 DIAGNOSIS — Z79899 Other long term (current) drug therapy: Secondary | ICD-10-CM | POA: Diagnosis not present

## 2024-12-15 DIAGNOSIS — I479 Paroxysmal tachycardia, unspecified: Secondary | ICD-10-CM | POA: Diagnosis not present

## 2024-12-15 DIAGNOSIS — R002 Palpitations: Secondary | ICD-10-CM | POA: Diagnosis not present

## 2024-12-15 DIAGNOSIS — E782 Mixed hyperlipidemia: Secondary | ICD-10-CM

## 2024-12-15 MED ORDER — ASPIRIN 81 MG PO TBEC: 81.0000 mg | DELAYED_RELEASE_TABLET | Freq: Every day | ORAL | Status: AC

## 2024-12-15 NOTE — Progress Notes (Signed)
" °  Cardiology Office Note   Date:  12/15/2024  ID:  Jillian Thomas, DOB Dicy 13, 1952, MRN [id number] PCP: Autry Grayce LABOR, PA  [facility] Providers Cardiologist:  Caron Poser, MD     History of Present Illness Jillian Thomas is a 74 y.o. female PMH HLD, who presents for further evaluation management of paroxysmal tachycardia.  Seen by PCP for this issue 11/30/2024.  LDL 50 11/2024.  Potassium normal.  Creatinine normal.  No recent TSH on file.  Patient reports palpitations that seem to be exertional in character.  Denies any syncopal episodes.  She says that it happens pretty much anytime she stands up or moves around.  Episodes tend to last for 5 to 10 minutes before abating.  She denies any history of this issue in the past.  She denies any chest discomfort.  She denies any dyspnea on exertion, orthopnea, edema, or signs or symptoms of heart failure.  Relevant CVD History - CTPA 11/2021 moderate CAC of the LAD - TTE 02/2016 LVEF 50 to 55%, grade 1 diastolic dysfunction, mild MR - Holter 03/2016 mean heart rate 72 bpm, no arrhythmias, rare ectopy - SPECT 02/2016 with hypertensive response to exercise and normal perfusion   ROS: Pt denies any chest discomfort, jaw pain, arm pain, syncope, presyncope, orthopnea, PND, or LE edema.  Studies Reviewed I have independently reviewed the patient's ECG, previous cardiac testing, previous CT scan, previous medical records, previous blood work.  Physical Exam VS:  BP 116/82 (BP Location: Left Arm, Patient Position: Sitting, Cuff Size: Large)   Pulse 83   Ht 5' 1 (1.549 m)   Wt 183 lb (83 kg)   LMP 03/26/2006   SpO2 97%   BMI 34.58 kg/m        Wt Readings from Last 3 Encounters:  12/15/24 183 lb (83 kg)  11/06/24 179 lb 14.3 oz (81.6 kg)  09/17/24 180 lb (81.6 kg)    GEN: No acute distress. NECK: No JVD; No carotid bruits. CARDIAC: RRR, no murmurs, rubs, gallops. RESPIRATORY:  Clear to auscultation. EXTREMITIES:  Warm and  well-perfused. No edema.  ASSESSMENT AND PLAN Paroxysmal tachycardia Palpitations Patient presents with undifferentiated palpitations and paroxysmal tachycardia.  No high risk symptoms such as syncope.  No preexcitation or QT prolongation.  No history of this in the past.  Plan: - Zio monitor to evaluate for arrhythmogenic causes - Echocardiogram to evaluate for structural causes - Check thyroid panel - Hold off on treatment for now until we have more information  CAC HLD Seen on prior CT scan 11/2021.  Last LDL well-controlled at 50 06/7972.  Plan: - Start ASA 81 mg daily - Continue Lipitor 20 mg daily       Dispo: RTC 3 months or sooner as needed  Signed, Caron Poser, MD  "

## 2024-12-15 NOTE — Patient Instructions (Addendum)
 Medication Instructions:  Your physician recommends the following medication changes.  START TAKING:  Aspirin  81 mg once daily  Continue all other medications as prescribed.  *If you need a refill on your cardiac medications before your next appointment, please call your pharmacy*  Lab Work:  Your provider would like for you to have following labs drawn today Thyroid Panel with TSH.    If you have labs (blood work) drawn today and your tests are completely normal, you will receive your results only by: MyChart Message (if you have MyChart) OR A paper copy in the mail If you have any lab test that is abnormal or we need to change your treatment, we will call you to review the results.  Testing/Procedures:  Your physician has requested that you have an echocardiogram. Echocardiography is a painless test that uses sound waves to create images of your heart. It provides your doctor with information about the size and shape of your heart and how well your hearts chambers and valves are working.   You may receive an ultrasound enhancing agent through an IV if needed to better visualize your heart during the echo. This procedure takes approximately one hour.  There are no restrictions for this procedure.  This will take place at [address] [address], [place] [address]  [name]- Long Term Monitor Instructions  Your physician has requested you wear a ZIO patch monitor for 14 days.  This is a single patch monitor. [employer] supplies one patch monitor per enrollment. Additional stickers are not available. Please do not apply patch if you will be having a Nuclear Stress Test, Echocardiogram, Cardiac CT, MRI, or Chest Xray during the period you would be wearing the monitor. The patch cannot be worn during these tests. You cannot remove and re-apply the ZIO XT patch monitor.  Your ZIO patch monitor will be mailed 3 day USPS to your address on file. It may take 3-5 days to  receive your monitor after you have been enrolled. Once you have received your monitor, please review the enclosed instructions. Your monitor has already been registered assigning a specific monitor serial number to you.  Billing and Patient Assistance Program Information  We have supplied [employer] with any of your insurance information on file for billing purposes.  [employer] offers a sliding scale Patient Assistance Program for patients that do not have insurance, or whose insurance does not completely cover the cost of the ZIO monitor.  You must apply for the Patient Assistance Program to qualify for this discounted rate.  To apply, please call [employer] at [phone], select option 4, select option 2, ask to apply for Patient Assistance Program. Meredeth will ask your household income, and how many people are in your household. They will quote your out-of-pocket cost based on that information. [employer] will also be able to set up a 74-month, interest-free payment plan if needed.  Applying the monitor  (Applied in clinic on 12/15/2024)  Hold abrader disc by orange tab. Rub abrader in 40 strokes over the upper left chest as indicated in your monitor instructions.  Clean area with 4 enclosed alcohol pads. Let dry.  Apply patch as indicated in monitor instructions. Patch will be placed under collarbone on left side of chest with arrow pointing upward.  Rub patch adhesive wings for 2 minutes. Remove white label marked 1. Remove the white label marked 2. Rub patch adhesive wings for 2 additional minutes.  While looking in a mirror, press and  release button in center of patch. A small green light will flash 3-4 times. This will be your only indicator that the monitor has been turned on.   After Applying Monitor: Do not shower for the first 24 hours. You may shower after the first 24 hours; however, you must keep your back toward water.  Monitor cannot be submerged in water. Press the button if you feel a  symptom. You will hear a small click. Record Date, Time and Symptom in the Patient Logbook.   After Completing 14 Days: (Remove monitor on 12/30/2024)  When you are ready to remove the patch, follow instructions on the last 2 pages of Patient Logbook.  Stick patch monitor into the tabs at the bottom of the return box.  Place Patient Logbook in the blue and white box. Use locking tab on box and tape box closed securely. The blue and white box has prepaid postage on it. Please place it in the mailbox as soon as possible. Your physician should have your test results approximately 7-14 days after the monitor has been mailed back to [facility].   Troubleshooting: Call [facility] at [phone] if you have questions regarding your ZIO XT patch monitor.  Call them immediately if you see an orange light blinking on your monitor.  If your monitor falls off in less than 4 days, contact our Monitor department at [phone].  If your monitor becomes loose or falls off after 4 days call [employer] at [phone] for suggestions on securing your monitor.  Follow-Up: At [facility], you and your health needs are our priority.  As part of our continuing mission to provide you with exceptional heart care, our providers are all part of one team.  This team includes your primary Cardiologist (physician) and Advanced Practice Providers or APPs (Physician Assistants and Nurse Practitioners) who all work together to provide you with the care you need, when you need it.  Your next appointment:  3 month(s)  Provider:  Caron Poser, MD    We recommend signing up for the patient portal called MyChart.  Sign up information is provided on this After Visit Summary.  MyChart is used to connect with patients for Virtual Visits (Telemedicine).  Patients are able to view lab/test results, encounter notes, upcoming appointments, etc.  Non-urgent messages can be sent to your provider as well.    To learn more about what you can do with MyChart, go to [email or web address].

## 2024-12-16 ENCOUNTER — Ambulatory Visit: Payer: Self-pay

## 2024-12-16 LAB — THYROID PANEL WITH TSH
Free Thyroxine Index: 2.4 (ref 1.2–4.9)
T3 Uptake Ratio: 26 % (ref 24–39)
T4, Total: 9.1 ug/dL (ref 4.5–12.0)
TSH: 0.734 u[IU]/mL (ref 0.450–4.500)

## 2025-01-17 ENCOUNTER — Ambulatory Visit

## 2025-03-14 ENCOUNTER — Ambulatory Visit
